# Patient Record
Sex: Female | Born: 1965 | Race: Black or African American | Hispanic: No | Smoking: Current every day smoker
Health system: Southern US, Community
[De-identification: ages and names within clinical notes are randomized; demographics above are authoritative.]

## PROBLEM LIST (undated history)

## (undated) DIAGNOSIS — E042 Nontoxic multinodular goiter: Secondary | ICD-10-CM

## (undated) DIAGNOSIS — D649 Anemia, unspecified: Secondary | ICD-10-CM

## (undated) DIAGNOSIS — F17200 Nicotine dependence, unspecified, uncomplicated: Secondary | ICD-10-CM

## (undated) HISTORY — DX: Nontoxic multinodular goiter: E04.2

## (undated) HISTORY — DX: Nicotine dependence, unspecified, uncomplicated: F17.200

---

## 2000-12-19 ENCOUNTER — Other Ambulatory Visit: Admission: RE | Admit: 2000-12-19 | Discharge: 2000-12-19 | Payer: Self-pay | Admitting: Obstetrics and Gynecology

## 2004-11-14 ENCOUNTER — Ambulatory Visit (HOSPITAL_COMMUNITY): Admission: RE | Admit: 2004-11-14 | Discharge: 2004-11-14 | Payer: Self-pay | Admitting: Obstetrics and Gynecology

## 2004-12-14 ENCOUNTER — Other Ambulatory Visit: Admission: RE | Admit: 2004-12-14 | Discharge: 2004-12-14 | Payer: Self-pay | Admitting: Obstetrics and Gynecology

## 2005-07-27 ENCOUNTER — Emergency Department (HOSPITAL_COMMUNITY): Admission: EM | Admit: 2005-07-27 | Discharge: 2005-07-27 | Payer: Self-pay | Admitting: Emergency Medicine

## 2008-07-31 ENCOUNTER — Emergency Department (HOSPITAL_COMMUNITY): Admission: EM | Admit: 2008-07-31 | Discharge: 2008-07-31 | Payer: Self-pay | Admitting: Emergency Medicine

## 2010-03-02 ENCOUNTER — Ambulatory Visit (HOSPITAL_COMMUNITY)
Admission: RE | Admit: 2010-03-02 | Discharge: 2010-03-02 | Payer: Self-pay | Source: Home / Self Care | Attending: Family Medicine | Admitting: Family Medicine

## 2010-03-03 ENCOUNTER — Encounter: Payer: Self-pay | Admitting: Obstetrics and Gynecology

## 2010-03-04 ENCOUNTER — Encounter: Payer: Self-pay | Admitting: Obstetrics & Gynecology

## 2011-08-26 ENCOUNTER — Emergency Department (HOSPITAL_COMMUNITY)
Admission: EM | Admit: 2011-08-26 | Discharge: 2011-08-26 | Disposition: A | Discharge status: Home / Self Care | Payer: 59 | Attending: Emergency Medicine | Admitting: Emergency Medicine

## 2011-08-26 ENCOUNTER — Encounter (HOSPITAL_COMMUNITY): Payer: Self-pay | Admitting: *Deleted

## 2011-08-26 DIAGNOSIS — L259 Unspecified contact dermatitis, unspecified cause: Secondary | ICD-10-CM | POA: Insufficient documentation

## 2011-08-26 MED ORDER — PREDNISONE 10 MG PO TABS: ORAL_TABLET | ORAL | Status: DC

## 2011-08-26 MED ORDER — DEXAMETHASONE SODIUM PHOSPHATE 10 MG/ML IJ SOLN
10.0000 mg | Freq: Once | INTRAMUSCULAR | Status: AC
  Administered 2011-08-26: 10 mg via INTRAMUSCULAR
  Filled 2011-08-26: qty 1

## 2011-08-26 MED ORDER — DIPHENHYDRAMINE HCL 25 MG PO CAPS
50.0000 mg | ORAL_CAPSULE | Freq: Once | ORAL | Status: AC
  Administered 2011-08-26: 50 mg via ORAL
  Filled 2011-08-26: qty 2

## 2011-08-26 NOTE — ED Notes (Signed)
Scalp itching with rash, used hair dye on Saturday.

## 2011-08-29 NOTE — ED Provider Notes (Signed)
History     CSN: [id number]  Arrival date & time 08/26/11  1906   First MD Initiated Contact with Patient 08/26/11 2006      Chief Complaint  Patient presents with  . Allergic Reaction    (Consider location/radiation/quality/duration/timing/severity/associated sxs/prior treatment) HPI Comments: Crystal Clay presents with itchy and raised rash along hairline and on upper scalp since applying a hair dye 2 days ago.  She reports she still has the product in her hair as she was afraid to spread it.  She has a known allergy to certain dye products,  And has always been careful to use one she will not react to,  But was unable to find it for this weekends use.  She denies fevers,  Chills,  Denies cough,  Shortness of breath,  Mouth,  Tongue or throat swelling.  She has tried no alleviators for her symptoms.  The history is provided by the patient.    History reviewed. No pertinent past medical history.  History reviewed. No pertinent past surgical history.  History reviewed. No pertinent family history.  History  Substance Use Topics  . Smoking status: Current Everyday Smoker  . Smokeless tobacco: Not on file  . Alcohol Use: No    OB History    Grav Para Term Preterm Abortions TAB SAB Ect Mult Living                  Review of Systems  Constitutional: Negative for fever.  HENT: Negative for congestion, sore throat and neck pain.   Eyes: Negative.   Respiratory: Negative for chest tightness and shortness of breath.   Cardiovascular: Negative for chest pain.  Gastrointestinal: Negative for nausea and abdominal pain.  Genitourinary: Negative.   Musculoskeletal: Negative for joint swelling and arthralgias.  Skin: Positive for rash.  Neurological: Negative for dizziness, weakness, light-headedness, numbness and headaches.  Hematological: Negative.   Psychiatric/Behavioral: Negative.     Allergies  Review of patient's allergies indicates no known allergies.  Home  Medications   Current Outpatient Rx  Name Route Sig Dispense Refill  . PREDNISONE 10 MG PO TABS  6, 5, 4, 3, 2 then 1 tablet by mouth daily for 6 days total. 21 tablet 0    BP 144/98  Pulse 94  Temp 98.1 F (36.7 C) (Oral)  Resp 20  SpO2 100%  LMP 08/24/2011  Physical Exam  Constitutional: She appears well-developed and well-nourished. No distress.  HENT:  Head: Normocephalic.  Neck: Neck supple.  Cardiovascular: Normal rate.   Pulmonary/Chest: Effort normal. She has no wheezes.  Musculoskeletal: Normal range of motion. She exhibits no edema.  Skin: Rash noted. Rash is vesicular.       Scattered patches of slightly raised macules with vesicles,  Nondraining along edged of hairline and neck.  Right external ear also has moderated edema and erythema.  No increased warmth,  No drainage or tenderness.  Slight erythema of scalp particularly at crown of head with no vesicles appreciated within hairline.    ED Course  Procedures (including critical care time)  Labs Reviewed - No data to display No results found.   1. Contact dermatitis       MDM  Pt encouraged benadryl both oral,  But can also use topical .  Prednisone taper with decadron injection given prior to dc home.  Pt advised that she needs to wash this product out of her hair as soon as she gets home,  Pt understands plan.  Return here  or see pcp for any worsened sx.    No evidence of infection in these areas of dermatitis.    Burgess Amor, PA 08/29/11 0954  Burgess Amor, PA 08/29/11 [phone]

## 2011-08-29 NOTE — ED Provider Notes (Signed)
Medical screening examination/treatment/procedure(s) were performed by non-physician practitioner and as supervising physician I was immediately available for consultation/collaboration.   Laray Anger, DO 08/29/11 1417

## 2011-09-25 ENCOUNTER — Other Ambulatory Visit: Payer: Self-pay | Admitting: Family Medicine

## 2011-09-25 DIAGNOSIS — E049 Nontoxic goiter, unspecified: Secondary | ICD-10-CM

## 2011-10-01 ENCOUNTER — Ambulatory Visit (HOSPITAL_COMMUNITY)
Admission: RE | Admit: 2011-10-01 | Discharge: 2011-10-01 | Disposition: A | Discharge status: Home / Self Care | Payer: 59 | Source: Ambulatory Visit | Attending: Family Medicine | Admitting: Family Medicine

## 2011-10-01 DIAGNOSIS — E049 Nontoxic goiter, unspecified: Secondary | ICD-10-CM | POA: Insufficient documentation

## 2011-12-30 ENCOUNTER — Other Ambulatory Visit: Payer: Self-pay | Admitting: Family Medicine

## 2011-12-30 DIAGNOSIS — IMO0002 Reserved for concepts with insufficient information to code with codable children: Secondary | ICD-10-CM

## 2012-01-01 ENCOUNTER — Ambulatory Visit (HOSPITAL_COMMUNITY)
Admission: RE | Admit: 2012-01-01 | Discharge: 2012-01-01 | Disposition: A | Discharge status: Home / Self Care | Payer: 59 | Source: Ambulatory Visit | Attending: Family Medicine | Admitting: Family Medicine

## 2012-01-01 DIAGNOSIS — T63391A Toxic effect of venom of other spider, accidental (unintentional), initial encounter: Secondary | ICD-10-CM | POA: Insufficient documentation

## 2012-01-01 DIAGNOSIS — T6391XA Toxic effect of contact with unspecified venomous animal, accidental (unintentional), initial encounter: Secondary | ICD-10-CM | POA: Insufficient documentation

## 2012-01-01 DIAGNOSIS — N63 Unspecified lump in unspecified breast: Secondary | ICD-10-CM | POA: Insufficient documentation

## 2012-01-01 DIAGNOSIS — IMO0002 Reserved for concepts with insufficient information to code with codable children: Secondary | ICD-10-CM

## 2013-05-10 ENCOUNTER — Other Ambulatory Visit: Payer: Self-pay | Admitting: Family Medicine

## 2013-05-10 DIAGNOSIS — Z1231 Encounter for screening mammogram for malignant neoplasm of breast: Secondary | ICD-10-CM

## 2013-05-17 ENCOUNTER — Ambulatory Visit (HOSPITAL_COMMUNITY)
Admission: RE | Admit: 2013-05-17 | Discharge: 2013-05-17 | Disposition: A | Discharge status: Home / Self Care | Payer: 59 | Source: Ambulatory Visit | Attending: Family Medicine | Admitting: Family Medicine

## 2013-05-17 DIAGNOSIS — Z1231 Encounter for screening mammogram for malignant neoplasm of breast: Secondary | ICD-10-CM

## 2014-03-22 ENCOUNTER — Other Ambulatory Visit: Payer: 59

## 2014-03-22 DIAGNOSIS — Z0000 Encounter for general adult medical examination without abnormal findings: Secondary | ICD-10-CM

## 2014-03-22 LAB — CBC WITH DIFFERENTIAL/PLATELET
Basophils Absolute: 0 10*3/uL (ref 0.0–0.1)
Basophils Relative: 0 % (ref 0–1)
Eosinophils Absolute: 0.1 10*3/uL (ref 0.0–0.7)
Eosinophils Relative: 2 % (ref 0–5)
HCT: 37.6 % (ref 36.0–46.0)
Hemoglobin: 11.7 g/dL — ABNORMAL LOW (ref 12.0–15.0)
Lymphocytes Relative: 42 % (ref 12–46)
Lymphs Abs: 2 10*3/uL (ref 0.7–4.0)
MCH: 25.1 pg — AB (ref 26.0–34.0)
MCHC: 31.1 g/dL (ref 30.0–36.0)
MCV: 80.7 fL (ref 78.0–100.0)
MPV: 9.3 fL (ref 8.6–12.4)
Monocytes Absolute: 0.4 10*3/uL (ref 0.1–1.0)
Monocytes Relative: 9 % (ref 3–12)
NEUTROS PCT: 47 % (ref 43–77)
Neutro Abs: 2.3 10*3/uL (ref 1.7–7.7)
Platelets: 331 10*3/uL (ref 150–400)
RBC: 4.66 MIL/uL (ref 3.87–5.11)
RDW: 20.4 % — ABNORMAL HIGH (ref 11.5–15.5)
WBC: 4.8 10*3/uL (ref 4.0–10.5)

## 2014-03-22 LAB — LIPID PANEL
CHOL/HDL RATIO: 3.8 ratio
Cholesterol: 156 mg/dL (ref 0–200)
HDL: 41 mg/dL (ref >39)
LDL Cholesterol: 100 mg/dL — ABNORMAL HIGH (ref 0–99)
Triglycerides: 77 mg/dL (ref <150)
VLDL: 15 mg/dL (ref 0–40)

## 2014-03-22 LAB — COMPLETE METABOLIC PANEL WITH GFR
ALT: <8 U/L (ref 0–35)
AST: 13 U/L (ref 0–37)
Albumin: 3.8 g/dL (ref 3.5–5.2)
Alkaline Phosphatase: 102 U/L (ref 39–117)
BUN: 7 mg/dL (ref 6–23)
CO2: 25 mEq/L (ref 19–32)
Calcium: 8.8 mg/dL (ref 8.4–10.5)
Chloride: 105 mEq/L (ref 96–112)
Creat: 0.52 mg/dL (ref 0.50–1.10)
GFR, Est African American: >89 mL/min
GFR, Est Non African American: >89 mL/min
Glucose, Bld: 88 mg/dL (ref 70–99)
Potassium: 4.3 mEq/L (ref 3.5–5.3)
Sodium: 139 mEq/L (ref 135–145)
Total Bilirubin: 0.4 mg/dL (ref 0.2–1.2)
Total Protein: 7 g/dL (ref 6.0–8.3)

## 2014-03-22 LAB — TSH: TSH: 1.179 u[IU]/mL (ref 0.350–4.500)

## 2014-03-25 ENCOUNTER — Encounter: Payer: Self-pay | Admitting: Family Medicine

## 2014-03-25 ENCOUNTER — Ambulatory Visit (INDEPENDENT_AMBULATORY_CARE_PROVIDER_SITE_OTHER): Payer: 59 | Admitting: Family Medicine

## 2014-03-25 VITALS — BP 140/80 | HR 86 | Temp 98.2°F | Resp 18 | Ht 69.5 in | Wt 218.0 lb

## 2014-03-25 DIAGNOSIS — F17200 Nicotine dependence, unspecified, uncomplicated: Secondary | ICD-10-CM | POA: Insufficient documentation

## 2014-03-25 DIAGNOSIS — Z0000 Encounter for general adult medical examination without abnormal findings: Secondary | ICD-10-CM

## 2014-03-25 NOTE — Progress Notes (Signed)
Subjective:    Patient ID: Crystal Clay, female    DOB: Nov 06, 1965, 49 y.o.   MRN: [id number]  HPI  Patient is here today for complete physical exam. Unfortunately she continues to smoke. Her last Pap smear was in 2014. However she is on her period today and she prefer that we defer this to another time. She is not due for a colonoscopy. She is due for mammogram given her strong family history of breast cancer. Her fasting blood work was reviewed with the patient today and is listed below: Lab on 03/22/2014  Component Date Value Ref Range Status  . Sodium 03/22/2014 139  135 - 145 mEq/L Final  . Potassium 03/22/2014 4.3  3.5 - 5.3 mEq/L Final  . Chloride 03/22/2014 105  96 - 112 mEq/L Final  . CO2 03/22/2014 25  19 - 32 mEq/L Final  . Glucose, Bld 03/22/2014 88  70 - 99 mg/dL Final  . BUN 03/22/2014 7  6 - 23 mg/dL Final  . Creat 03/22/2014 0.52  0.50 - 1.10 mg/dL Final  . Total Bilirubin 03/22/2014 0.4  0.2 - 1.2 mg/dL Final  . Alkaline Phosphatase 03/22/2014 102  39 - 117 U/L Final  . AST 03/22/2014 13  0 - 37 U/L Final  . ALT 03/22/2014 <8  0 - 35 U/L Final  . Total Protein 03/22/2014 7.0  6.0 - 8.3 g/dL Final  . Albumin 03/22/2014 3.8  3.5 - 5.2 g/dL Final  . Calcium 03/22/2014 8.8  8.4 - 10.5 mg/dL Final  . GFR, Est African American 03/22/2014 >89   Final  . GFR, Est Non African American 03/22/2014 >89   Final   Comment:   The estimated GFR is a calculation valid for adults (>=18 years old) that uses the CKD-EPI algorithm to adjust for age and sex. It is   not to be used for children, pregnant women, hospitalized patients,    patients on dialysis, or with rapidly changing kidney function. According to the NKDEP, eGFR >89 is normal, 60-89 shows mild impairment, 30-59 shows moderate impairment, 15-29 shows severe impairment and <15 is ESRD.     . TSH 03/22/2014 1.179  0.350 - 4.500 uIU/mL Final  . Cholesterol 03/22/2014 156  0 - 200 mg/dL Final   Comment: ATP III  Classification:       < 200        mg/dL        Desirable      200 - 239     mg/dL        Borderline High      >= 240        mg/dL        High     . Triglycerides 03/22/2014 77  <150 mg/dL Final  . HDL 03/22/2014 41  >39 mg/dL Final  . Total CHOL/HDL Ratio 03/22/2014 3.8   Final  . VLDL 03/22/2014 15  0 - 40 mg/dL Final  . LDL Cholesterol 03/22/2014 100* 0 - 99 mg/dL Final   Comment:   Total Cholesterol/HDL Ratio:CHD Risk                        Coronary Heart Disease Risk Table                                        Men  Women          1/2 Average Risk              3.4        3.3              Average Risk              5.0        4.4           2X Average Risk              9.6        7.1           3X Average Risk             23.4       11.0 Use the calculated Patient Ratio above and the CHD Risk table  to determine the patient's CHD Risk. ATP III Classification (LDL):       < 100        mg/dL         Optimal      100 - 129     mg/dL         Near or Above Optimal      130 - 159     mg/dL         Borderline High      160 - 189     mg/dL         High       > 190        mg/dL         Very High     . WBC 03/22/2014 4.8  4.0 - 10.5 K/uL Final  . RBC 03/22/2014 4.66  3.87 - 5.11 MIL/uL Final  . Hemoglobin 03/22/2014 11.7* 12.0 - 15.0 g/dL Final  . HCT 03/22/2014 37.6  36.0 - 46.0 % Final  . MCV 03/22/2014 80.7  78.0 - 100.0 fL Final  . MCH 03/22/2014 25.1* 26.0 - 34.0 pg Final  . MCHC 03/22/2014 31.1  30.0 - 36.0 g/dL Final  . RDW 03/22/2014 20.4* 11.5 - 15.5 % Final  . Platelets 03/22/2014 331  150 - 400 K/uL Final  . MPV 03/22/2014 9.3  8.6 - 12.4 fL Final  . Neutrophils Relative % 03/22/2014 47  43 - 77 % Final  . Neutro Abs 03/22/2014 2.3  1.7 - 7.7 K/uL Final  . Lymphocytes Relative 03/22/2014 42  12 - 46 % Final  . Lymphs Abs 03/22/2014 2.0  0.7 - 4.0 K/uL Final  . Monocytes Relative 03/22/2014 9  3 - 12 % Final  . Monocytes Absolute 03/22/2014 0.4  0.1 - 1.0 K/uL Final    . Eosinophils Relative 03/22/2014 2  0 - 5 % Final  . Eosinophils Absolute 03/22/2014 0.1  0.0 - 0.7 K/uL Final  . Basophils Relative 03/22/2014 0  0 - 1 % Final  . Basophils Absolute 03/22/2014 0.0  0.0 - 0.1 K/uL Final  . Smear Review 03/22/2014 Criteria for review not met   Final   Past Medical History  Diagnosis Date  . Smoker   . Multinodular goiter    No past surgical history on file. No current outpatient prescriptions on file prior to visit.   No current facility-administered medications on file prior to visit.   No Known Allergies History   Social History  . Marital Status: Divorced    Spouse Name: N/A  . Number of Children:  N/A  . Years of Education: N/A   Occupational History  . Not on file.   Social History Main Topics  . Smoking status: Current Every Day Smoker  . Smokeless tobacco: Not on file  . Alcohol Use: No  . Drug Use: No  . Sexual Activity: Not on file     Comment: divorced, 1 son, Web designer   Other Topics Concern  . Not on file   Social History Narrative   Family History  Problem Relation Age of Onset  . Cancer Mother     breast  . COPD Mother   . Cancer Sister     breast  . Heart disease Maternal Grandfather      Review of Systems  All other systems reviewed and are negative.      Objective:   Physical Exam  Constitutional: She is oriented to person, place, and time. She appears well-developed and well-nourished. No distress.  HENT:  Head: Normocephalic and atraumatic.  Right Ear: External ear normal.  Left Ear: External ear normal.  Nose: Nose normal.  Mouth/Throat: Oropharynx is clear and moist. No oropharyngeal exudate.  Eyes: Conjunctivae and EOM are normal. Pupils are equal, round, and reactive to light. Right eye exhibits no discharge. Left eye exhibits no discharge. No scleral icterus.  Neck: Normal range of motion. Neck supple. No JVD present. No tracheal deviation present. No thyromegaly present.   Cardiovascular: Normal rate, regular rhythm, normal heart sounds and intact distal pulses.  Exam reveals no gallop and no friction rub.   No murmur heard. Pulmonary/Chest: Effort normal and breath sounds normal. No stridor. No respiratory distress. She has no wheezes. She has no rales. She exhibits no tenderness.  Abdominal: Soft. Bowel sounds are normal. She exhibits no distension and no mass. There is no tenderness. There is no rebound and no guarding.  Musculoskeletal: Normal range of motion. She exhibits no edema or tenderness.  Lymphadenopathy:    She has no cervical adenopathy.  Neurological: She is alert and oriented to person, place, and time. She has normal reflexes. She displays normal reflexes. No cranial nerve deficit. She exhibits normal muscle tone. Coordination normal.  Skin: Skin is warm. No rash noted. She is not diaphoretic. No erythema. No pallor.  Psychiatric: She has a normal mood and affect. Her behavior is normal. Judgment and thought content normal.  Vitals reviewed.         Assessment & Plan:  Routine general medical examination at a health care facility - Plan: MM Digital Screening  I recommended smoking cessation. I also recommended the patient check her blood pressure everyday for the next week and bring the values to me so that I can reviewed. If her blood pressures consistently greater than 140/90 I would add medication. I will schedule the patient for a mammogram. Also gave the patient a flu shot today. We can defer her Pap smear until a more convenient time for the patient.

## 2014-03-29 ENCOUNTER — Encounter: Payer: Self-pay | Admitting: *Deleted

## 2014-07-14 ENCOUNTER — Other Ambulatory Visit: Payer: Self-pay | Admitting: Family Medicine

## 2014-07-14 DIAGNOSIS — Z1231 Encounter for screening mammogram for malignant neoplasm of breast: Secondary | ICD-10-CM

## 2014-08-08 ENCOUNTER — Ambulatory Visit (HOSPITAL_COMMUNITY)
Admission: RE | Admit: 2014-08-08 | Discharge: 2014-08-08 | Disposition: A | Discharge status: Home / Self Care | Payer: 59 | Source: Ambulatory Visit | Attending: Family Medicine | Admitting: Family Medicine

## 2014-08-08 DIAGNOSIS — Z1231 Encounter for screening mammogram for malignant neoplasm of breast: Secondary | ICD-10-CM

## 2015-03-23 ENCOUNTER — Ambulatory Visit (INDEPENDENT_AMBULATORY_CARE_PROVIDER_SITE_OTHER): Payer: 59 | Admitting: Family Medicine

## 2015-03-23 ENCOUNTER — Encounter: Payer: Self-pay | Admitting: Family Medicine

## 2015-03-23 VITALS — BP 132/78 | HR 78 | Temp 98.0°F | Resp 18 | Ht 69.5 in | Wt 242.0 lb

## 2015-03-23 DIAGNOSIS — J45901 Unspecified asthma with (acute) exacerbation: Secondary | ICD-10-CM

## 2015-03-23 MED ORDER — PREDNISONE 20 MG PO TABS
ORAL_TABLET | ORAL | Status: DC
Start: 2015-03-23 — End: 2015-05-19

## 2015-03-23 MED ORDER — ALBUTEROL SULFATE HFA 108 (90 BASE) MCG/ACT IN AERS: 2.0000 | INHALATION_SPRAY | Freq: Four times a day (QID) | RESPIRATORY_TRACT | Status: DC | PRN

## 2015-03-23 MED ORDER — AZITHROMYCIN 250 MG PO TABS: ORAL_TABLET | ORAL | Status: DC

## 2015-03-23 NOTE — Progress Notes (Signed)
   Subjective:    Patient ID: Crystal Clay, female    DOB: 1965/09/02, 50 y.o.   MRN: [id number]  HPI  patient got sick one week ago. Over the last week symptoms have worsened. She has a cough productive of yellow sputum, pleurisy, increasing shortness of breath , intercostal pain with coughing. And audible wheezing. She does smoke. She has a strong family history of COPD. On examination today she has diminished breath sounds bilaterally with expiratory wheezing and rhonchorous breath sounds Past Medical History  Diagnosis Date  . Smoker   . Multinodular goiter    No past surgical history on file. Current Outpatient Prescriptions on File Prior to Visit  Medication Sig Dispense Refill  . naproxen sodium (ANAPROX) 220 MG tablet Take 220 mg by mouth 2 (two) times daily with a meal.     No current facility-administered medications on file prior to visit.   No Known Allergies Social History   Social History  . Marital Status: Divorced    Spouse Name: N/A  . Number of Children: N/A  . Years of Education: N/A   Occupational History  . Not on file.   Social History Main Topics  . Smoking status: Current Every Day Smoker  . Smokeless tobacco: Not on file  . Alcohol Use: No  . Drug Use: No  . Sexual Activity: Not on file     Comment: divorced, 1 son, Web designer   Other Topics Concern  . Not on file   Social History Narrative      Review of Systems  All other systems reviewed and are negative.      Objective:   Physical Exam  Constitutional: She appears well-developed and well-nourished.  HENT:  Right Ear: External ear normal.  Left Ear: External ear normal.  Nose: Nose normal.  Mouth/Throat: Oropharynx is clear and moist.  Neck: Neck supple.  Cardiovascular: Normal rate, regular rhythm and normal heart sounds.   Pulmonary/Chest: Effort normal. She has wheezes.  Abdominal: Soft. Bowel sounds are normal.  Lymphadenopathy:    She has no cervical  adenopathy.  Vitals reviewed.         Assessment & Plan:  Asthmatic bronchitis with acute exacerbation - Plan: azithromycin (ZITHROMAX) 250 MG tablet, predniSONE (DELTASONE) 20 MG tablet, albuterol (PROVENTIL HFA;VENTOLIN HFA) 108 (90 Base) MCG/ACT inhaler   Patient has bronchitis.  There is an element of reactive airway disease or possibly early signs of COPD. I have recommended smoking cessation. Meanwhile I'll treat her with antibiotics , prednisone taper pack over 6 days, and albuterol 2 puffs inhaled every 6 hours as needed. Patient states that she will work on quitting smoking

## 2015-05-05 ENCOUNTER — Encounter: Payer: 59 | Admitting: Family Medicine

## 2015-05-19 ENCOUNTER — Ambulatory Visit (INDEPENDENT_AMBULATORY_CARE_PROVIDER_SITE_OTHER): Payer: 59 | Admitting: Family Medicine

## 2015-05-19 ENCOUNTER — Encounter: Payer: Self-pay | Admitting: Family Medicine

## 2015-05-19 VITALS — BP 136/90 | HR 76 | Temp 98.0°F | Resp 18 | Ht 69.0 in | Wt 241.0 lb

## 2015-05-19 DIAGNOSIS — Z0000 Encounter for general adult medical examination without abnormal findings: Secondary | ICD-10-CM | POA: Diagnosis not present

## 2015-05-19 DIAGNOSIS — E042 Nontoxic multinodular goiter: Secondary | ICD-10-CM | POA: Diagnosis not present

## 2015-05-19 LAB — COMPLETE METABOLIC PANEL WITH GFR
ALBUMIN: 3.9 g/dL (ref 3.6–5.1)
ALK PHOS: 101 U/L (ref 33–115)
ALT: 10 U/L (ref 6–29)
AST: 14 U/L (ref 10–35)
BUN: 10 mg/dL (ref 7–25)
CO2: 27 mmol/L (ref 20–31)
Calcium: 8.6 mg/dL (ref 8.6–10.2)
Chloride: 107 mmol/L (ref 98–110)
Creat: 0.52 mg/dL (ref 0.50–1.10)
GFR, Est African American: >89 mL/min (ref >=60)
GFR, Est Non African American: >89 mL/min (ref >=60)
Glucose, Bld: 93 mg/dL (ref 70–99)
Potassium: 4.2 mmol/L (ref 3.5–5.3)
SODIUM: 135 mmol/L (ref 135–146)
Total Bilirubin: 0.3 mg/dL (ref 0.2–1.2)
Total Protein: 6.6 g/dL (ref 6.1–8.1)

## 2015-05-19 LAB — LIPID PANEL
Cholesterol: 161 mg/dL (ref 125–200)
HDL: 44 mg/dL — ABNORMAL LOW (ref >=46)
LDL Cholesterol: 105 mg/dL (ref <130)
Total CHOL/HDL Ratio: 3.7 Ratio (ref <=5.0)
Triglycerides: 59 mg/dL (ref <150)
VLDL: 12 mg/dL (ref <30)

## 2015-05-19 LAB — CBC WITH DIFFERENTIAL/PLATELET
BASOS ABS: 0 {cells}/uL (ref 0–200)
Basophils Relative: 0 %
Eosinophils Absolute: 102 cells/uL (ref 15–500)
Eosinophils Relative: 2 %
HCT: 36.8 % (ref 35.0–45.0)
HEMOGLOBIN: 11.9 g/dL — AB (ref 12.0–15.0)
Lymphocytes Relative: 43 %
Lymphs Abs: 2193 cells/uL (ref 850–3900)
MCH: 29.2 pg (ref 27.0–33.0)
MCHC: 32.3 g/dL (ref 32.0–36.0)
MCV: 90.4 fL (ref 80.0–100.0)
MPV: 9.8 fL (ref 7.5–12.5)
Monocytes Absolute: 306 cells/uL (ref 200–950)
Monocytes Relative: 6 %
NEUTROS ABS: 2499 {cells}/uL (ref 1500–7800)
Neutrophils Relative %: 49 %
Platelets: 322 10*3/uL (ref 140–400)
RBC: 4.07 MIL/uL (ref 3.80–5.10)
RDW: 15.3 % — ABNORMAL HIGH (ref 11.0–15.0)
WBC: 5.1 10*3/uL (ref 3.8–10.8)

## 2015-05-19 LAB — TSH: TSH: 1.14 mIU/L

## 2015-05-19 NOTE — Progress Notes (Signed)
Subjective:    Patient ID: Crystal Clay, female    DOB: Feb 13, 1965, 50 y.o.   MRN: [id number]  HPI   Patient is here today for complete physical exam. Unfortunately she continues to smoke. Her last Pap smear was in 2014. However she is on her period today and she prefer that we defer this to another time. She is not due for a colonoscopy.  Her last mammogram was 6/16. She is willing to allow me to go ahead and schedule her to see the gastroenterologist for colonoscopy after she turns 50. This would be in August. Worsened swelling particularly on the right side of her neck. On examination today she does have a palpable goiter although I cannot appreciate any discrete mass in the goiter. She is also reporting weight gain and fatigue   Past Medical History  Diagnosis Date  . Smoker   . Multinodular goiter    No past surgical history on file. Current Outpatient Prescriptions on File Prior to Visit  Medication Sig Dispense Refill  . albuterol (PROVENTIL HFA;VENTOLIN HFA) 108 (90 Base) MCG/ACT inhaler Inhale 2 puffs into the lungs every 6 (six) hours as needed for wheezing or shortness of breath. 1 Inhaler 0  . naproxen sodium (ANAPROX) 220 MG tablet Take 220 mg by mouth 2 (two) times daily as needed.      No current facility-administered medications on file prior to visit.   No Known Allergies Social History   Social History  . Marital Status: Divorced    Spouse Name: N/A  . Number of Children: N/A  . Years of Education: N/A   Occupational History  . Not on file.   Social History Main Topics  . Smoking status: Current Every Day Smoker  . Smokeless tobacco: Not on file  . Alcohol Use: No  . Drug Use: No  . Sexual Activity: Not on file     Comment: divorced, 1 son, Web designer   Other Topics Concern  . Not on file   Social History Narrative   Family History  Problem Relation Age of Onset  . Cancer Mother     breast  . COPD Mother   . Cancer Sister    breast  . Heart disease Maternal Grandfather      Review of Systems  All other systems reviewed and are negative.      Objective:   Physical Exam  Constitutional: She is oriented to person, place, and time. She appears well-developed and well-nourished. No distress.  HENT:  Head: Normocephalic and atraumatic.  Right Ear: External ear normal.  Left Ear: External ear normal.  Nose: Nose normal.  Mouth/Throat: Oropharynx is clear and moist. No oropharyngeal exudate.  Eyes: Conjunctivae and EOM are normal. Pupils are equal, round, and reactive to light. Right eye exhibits no discharge. Left eye exhibits no discharge. No scleral icterus.  Neck: Normal range of motion. Neck supple. No JVD present. No tracheal deviation present. Thyromegaly present.  Cardiovascular: Normal rate, regular rhythm, normal heart sounds and intact distal pulses.  Exam reveals no gallop and no friction rub.   No murmur heard. Pulmonary/Chest: Effort normal and breath sounds normal. No stridor. No respiratory distress. She has no wheezes. She has no rales. She exhibits no tenderness.  Abdominal: Soft. Bowel sounds are normal. She exhibits no distension and no mass. There is no tenderness. There is no rebound and no guarding.  Musculoskeletal: Normal range of motion. She exhibits no edema or tenderness.  Lymphadenopathy:  She has no cervical adenopathy.  Neurological: She is alert and oriented to person, place, and time. She has normal reflexes. No cranial nerve deficit. She exhibits normal muscle tone. Coordination normal.  Skin: Skin is warm. No rash noted. She is not diaphoretic. No erythema. No pallor.  Psychiatric: She has a normal mood and affect. Her behavior is normal. Judgment and thought content normal.  Vitals reviewed.         Assessment & Plan:  Routine general medical examination at a health care facility - Plan: CBC with Differential/Platelet, COMPLETE METABOLIC PANEL WITH GFR, Lipid  panel Strongly recommended smoking cessation. Blood pressures borderline. I will schedule the patient for a thyroid ultrasound to rule out malignancies within the thyroid gland. If the ultrasound confirms a multinodular goiter, we can consider starting her on levothyroxine to prevent it from growing larger. I will also check a fasting lipid panel. Because of her smoking and would like her LDL cholesterol be below 100. I will check a CMP as well as a CBC. Offer the patient a flu shot and a tetanus shot but she politely declined. She will schedule her mammogram. She would like to reschedule a Pap smear

## 2015-05-22 ENCOUNTER — Encounter: Payer: Self-pay | Admitting: Family Medicine

## 2015-05-24 ENCOUNTER — Ambulatory Visit (HOSPITAL_COMMUNITY): Payer: 59

## 2015-07-03 ENCOUNTER — Ambulatory Visit (HOSPITAL_COMMUNITY): Admission: RE | Admit: 2015-07-03 | Payer: 59 | Source: Ambulatory Visit

## 2015-10-30 ENCOUNTER — Other Ambulatory Visit: Payer: Self-pay | Admitting: Family Medicine

## 2015-10-30 DIAGNOSIS — Z1231 Encounter for screening mammogram for malignant neoplasm of breast: Secondary | ICD-10-CM

## 2015-11-06 ENCOUNTER — Encounter (HOSPITAL_COMMUNITY): Payer: Self-pay | Admitting: Radiology

## 2015-11-06 ENCOUNTER — Ambulatory Visit (HOSPITAL_COMMUNITY)
Admission: RE | Admit: 2015-11-06 | Discharge: 2015-11-06 | Disposition: A | Discharge status: Home / Self Care | Payer: 59 | Source: Ambulatory Visit | Attending: Family Medicine | Admitting: Family Medicine

## 2015-11-06 DIAGNOSIS — Z1231 Encounter for screening mammogram for malignant neoplasm of breast: Secondary | ICD-10-CM | POA: Diagnosis not present

## 2016-02-23 ENCOUNTER — Ambulatory Visit (INDEPENDENT_AMBULATORY_CARE_PROVIDER_SITE_OTHER): Payer: 59 | Admitting: Family Medicine

## 2016-02-23 ENCOUNTER — Encounter: Payer: Self-pay | Admitting: Family Medicine

## 2016-02-23 VITALS — BP 142/88 | HR 68 | Temp 99.2°F | Resp 18 | Ht 69.0 in | Wt 247.0 lb

## 2016-02-23 DIAGNOSIS — J111 Influenza due to unidentified influenza virus with other respiratory manifestations: Secondary | ICD-10-CM | POA: Diagnosis not present

## 2016-02-23 MED ORDER — HYDROCODONE-HOMATROPINE 5-1.5 MG/5ML PO SYRP: 5.0000 mL | ORAL_SOLUTION | Freq: Three times a day (TID) | ORAL | 0 refills | Status: DC | PRN

## 2016-02-23 NOTE — Progress Notes (Signed)
   Subjective:    Patient ID: Crystal Clay, female    DOB: 1965-06-04, 51 y.o.   MRN: [id number]  HPI  This began on Sunday a proximally 5 days ago with sore throat. Within 24 hours the patient developed diffuse body aches and myalgias and low-grade fever to 101. She also developed runny nose and congestion. Wednesday symptoms peaked and she felt chest pain and shortness of breath and just generally rotten. However she started to improve yesterday and today she feels even better. She is wheezing on exam slightly but she has not used her albuterol today. She denies any fever this morning. She denies any chest pain or pleurisy. She denies any purulent sputum Past Medical History:  Diagnosis Date  . Multinodular goiter   . Smoker    No past surgical history on file. Current Outpatient Prescriptions on File Prior to Visit  Medication Sig Dispense Refill  . albuterol (PROVENTIL HFA;VENTOLIN HFA) 108 (90 Base) MCG/ACT inhaler Inhale 2 puffs into the lungs every 6 (six) hours as needed for wheezing or shortness of breath. 1 Inhaler 0   No current facility-administered medications on file prior to visit.    No Known Allergies Social History   Social History  . Marital status: Divorced    Spouse name: N/A  . Number of children: N/A  . Years of education: N/A   Occupational History  . Not on file.   Social History Main Topics  . Smoking status: Current Every Day Smoker  . Smokeless tobacco: Not on file  . Alcohol use No  . Drug use: No  . Sexual activity: Not on file     Comment: divorced, 1 son, Web designer   Other Topics Concern  . Not on file   Social History Narrative  . No narrative on file     Review of Systems  All other systems reviewed and are negative.      Objective:   Physical Exam  Constitutional: She appears well-developed and well-nourished. No distress.  HENT:  Right Ear: External ear normal.  Left Ear: External ear normal.  Nose: Mucosal  edema and rhinorrhea present.  Mouth/Throat: Oropharynx is clear and moist. No oropharyngeal exudate.  Eyes: Conjunctivae are normal.  Neck: Neck supple.  Cardiovascular: Normal rate, regular rhythm and normal heart sounds.   Pulmonary/Chest: Effort normal. She has wheezes. She has no rales.  Abdominal: Soft. Bowel sounds are normal.  Lymphadenopathy:    She has no cervical adenopathy.  Skin: She is not diaphoretic.  Vitals reviewed.         Assessment & Plan:  Influenza with respiratory manifestation - Plan: HYDROcodone-homatropine (HYCODAN) 5-1.5 MG/5ML syrup  Clinically the patient has influenza. Unfortunately she starting to improve. I recommended tincture of time. She is outside the therapeutic window for Tamiflu. Use ibuprofen as needed for body aches and fever. Use Mucinex as needed for cough. Also gave her Hycodan 1 teaspoon every 8 hours as needed for cough. I anticipate gradual improvement with complete resolution by Wednesday of next week if not sooner. Recheck immediately should her symptoms worsen or should she develop symptoms of pneumonia

## 2017-01-14 ENCOUNTER — Other Ambulatory Visit: Payer: Self-pay | Admitting: Family Medicine

## 2017-01-14 DIAGNOSIS — Z1231 Encounter for screening mammogram for malignant neoplasm of breast: Secondary | ICD-10-CM

## 2017-01-30 ENCOUNTER — Ambulatory Visit (HOSPITAL_COMMUNITY)
Admission: RE | Admit: 2017-01-30 | Discharge: 2017-01-30 | Disposition: A | Discharge status: Home / Self Care | Payer: 59 | Source: Ambulatory Visit | Attending: Family Medicine | Admitting: Family Medicine

## 2017-01-30 DIAGNOSIS — Z1231 Encounter for screening mammogram for malignant neoplasm of breast: Secondary | ICD-10-CM | POA: Diagnosis present

## 2017-02-15 DIAGNOSIS — M7061 Trochanteric bursitis, right hip: Secondary | ICD-10-CM | POA: Diagnosis not present

## 2017-02-17 ENCOUNTER — Ambulatory Visit: Payer: 59 | Admitting: Family Medicine

## 2017-02-19 ENCOUNTER — Encounter: Payer: Self-pay | Admitting: Family Medicine

## 2017-02-26 ENCOUNTER — Emergency Department (HOSPITAL_COMMUNITY): Payer: 59

## 2017-02-26 ENCOUNTER — Emergency Department (HOSPITAL_COMMUNITY)
Admission: EM | Admit: 2017-02-26 | Discharge: 2017-02-26 | Disposition: A | Discharge status: Home / Self Care | Payer: 59 | Attending: Emergency Medicine | Admitting: Emergency Medicine

## 2017-02-26 ENCOUNTER — Encounter (HOSPITAL_COMMUNITY): Payer: Self-pay | Admitting: *Deleted

## 2017-02-26 ENCOUNTER — Ambulatory Visit: Payer: 59 | Admitting: Family Medicine

## 2017-02-26 ENCOUNTER — Other Ambulatory Visit: Payer: Self-pay

## 2017-02-26 DIAGNOSIS — Z79899 Other long term (current) drug therapy: Secondary | ICD-10-CM | POA: Diagnosis not present

## 2017-02-26 DIAGNOSIS — M5416 Radiculopathy, lumbar region: Secondary | ICD-10-CM | POA: Diagnosis not present

## 2017-02-26 DIAGNOSIS — M79651 Pain in right thigh: Secondary | ICD-10-CM | POA: Diagnosis not present

## 2017-02-26 DIAGNOSIS — M25551 Pain in right hip: Secondary | ICD-10-CM | POA: Diagnosis present

## 2017-02-26 DIAGNOSIS — M5417 Radiculopathy, lumbosacral region: Secondary | ICD-10-CM | POA: Diagnosis not present

## 2017-02-26 DIAGNOSIS — F17210 Nicotine dependence, cigarettes, uncomplicated: Secondary | ICD-10-CM | POA: Diagnosis not present

## 2017-02-26 HISTORY — DX: Anemia, unspecified: D64.9

## 2017-02-26 MED ORDER — HYDROCODONE-ACETAMINOPHEN 5-325 MG PO TABS: ORAL_TABLET | ORAL | 0 refills | Status: DC

## 2017-02-26 MED ORDER — NAPROXEN 500 MG PO TABS: 500.0000 mg | ORAL_TABLET | Freq: Two times a day (BID) | ORAL | 0 refills | Status: DC

## 2017-02-26 MED ORDER — PREDNISONE 20 MG PO TABS: ORAL_TABLET | ORAL | 0 refills | Status: DC

## 2017-02-26 NOTE — Discharge Instructions (Signed)
Please read and follow all provided instructions.  Your diagnoses today include:  1. Lumbosacral radiculopathy at L4     Tests performed today include:  Vital signs - see below for your results today  X-ray of your femur - no problems  X-ray of lumbar spine - shows some arthritis and disc space narrowing between your 4th and 5th lumbar vertebrae  Medications prescribed:   Prednisone - steroid medicine   It is best to take this medication in the morning to prevent sleeping problems. If you are diabetic, monitor your blood sugar closely and stop taking Prednisone if blood sugar is over 300. Take with food to prevent stomach upset.    Naproxen - anti-inflammatory pain medication  Do not exceed 500mg  naproxen every 12 hours, take with food  You have been prescribed an anti-inflammatory medication or NSAID. Take with food. Take smallest effective dose for the shortest duration needed for your pain. Stop taking if you experience stomach pain or vomiting.    Vicodin (hydrocodone/acetaminophen) - narcotic pain medication  DO NOT drive or perform any activities that require you to be awake and alert because this medicine can make you drowsy. BE VERY CAREFUL not to take multiple medicines containing Tylenol (also called acetaminophen). Doing so can lead to an overdose which can damage your liver and cause liver failure and possibly death.  Take any prescribed medications only as directed.  Home care instructions:   Follow any educational materials contained in this packet  Please rest, use ice or heat on your back for the next several days  Do not lift, push, pull anything more than 10 pounds for the next week  Follow-up instructions: Please follow-up with your primary care provider as planned for further evaluation of your symptoms.   Return instructions:  SEEK IMMEDIATE MEDICAL ATTENTION IF YOU HAVE:  New numbness, tingling, weakness, or problem with the use of your arms or  legs  Severe back pain not relieved with medications  Loss control of your bowels or bladder  Increasing pain in any areas of the body (such as chest or abdominal pain)  Shortness of breath, dizziness, or fainting.   Worsening nausea (feeling sick to your stomach), vomiting, fever, or sweats  Any other emergent concerns regarding your health   Additional Information:  Your vital signs today were: BP (!) 159/86 (BP Location: Right Arm)    Pulse 74    Temp 98.2 F (36.8 C) (Oral)    Resp 16    Ht 5' 9.5" (1.765 m)    Wt 108.4 kg (239 lb)    SpO2 96%    BMI 34.79 kg/m  If your blood pressure (BP) was elevated above 135/85 this visit, please have this repeated by your doctor within one month. --------------

## 2017-02-26 NOTE — ED Triage Notes (Signed)
Pt reports ongoing severe pain and tingling to right hip and entire leg. Has been to md for it and given meds with no relief. Pain is into her groin and pt has difficulty bearing weight and walking when pain occurs. Pain started 3 weeks ago and getting progressively worse. Has occ numbness sensation to right arm when pain occurs.

## 2017-02-26 NOTE — ED Provider Notes (Signed)
[facility] EMERGENCY DEPARTMENT Provider Note   CSN: [id number] Arrival date & time: 02/26/17  2694     History   Chief Complaint Chief Complaint  Patient presents with  . Leg Pain    HPI Crystal Clay is a 52 y.o. female.  Patient presents to the emergency department today with complaints of right hip and thigh pain described as burning and shooting, severe at times, starting 4 weeks ago.  Pain was initially more mild at first and responded to over-the-counter medications at home.  When pain did not improve she went to an urgent care and was prescribed diclofenac.  This helped her symptoms at first, however was less responsive at end of course which she has now completed.  Pain is better when she is sitting in an upright position, worse with bearing weight and with other certain positions.  She has an appointment with her primary care doctor tomorrow for this, however pain was so bad today she could not wait to be seen. Patient denies warning symptoms of back pain including: fecal incontinence, urinary retention or overflow incontinence, night sweats, waking from sleep with back pain, unexplained fevers or weight loss, h/o cancer, IVDU, recent trauma.  She has never had pain like this before.  No abdominal pain or urinary symptoms.  Patient does not have significant pain in her back but starts in her posterior right hip. The onset of this condition was acute. The course is gradually worsening.    She also complains of R UE paresthesias that occur occasionally with her leg pain.  She denies any weakness.  No neck symptoms.  She denies any injuries or falls.       Past Medical History:  Diagnosis Date  . Anemia   . Multinodular goiter   . Smoker     Patient Active Problem List   Diagnosis Date Noted  . Smoker     History reviewed. No pertinent surgical history.  OB History    No data available       Home Medications    Prior to Admission  medications   Medication Sig Start Date End Date Taking? Authorizing Provider  albuterol (PROVENTIL HFA;VENTOLIN HFA) 108 (90 Base) MCG/ACT inhaler Inhale 2 puffs into the lungs every 6 (six) hours as needed for wheezing or shortness of breath. 03/23/15  Yes Susy Frizzle, MD  aspirin-acetaminophen-caffeine (EXCEDRIN MIGRAINE) [phone] MG tablet Take 2 tablets by mouth every 6 (six) hours as needed for headache.   Yes [provider]  diclofenac (VOLTAREN) 75 MG EC tablet Take 75 mg by mouth 2 (two) times daily. 02/15/17  Yes [provider]  Menthol-Methyl Salicylate (MUSCLE RUB) 10-15 % CREA Apply 1 application topically as needed for muscle pain.   Yes [provider]  naproxen sodium (ALEVE) 220 MG tablet Take 440 mg by mouth as needed (pain).   Yes [provider]  HYDROcodone-acetaminophen (NORCO/VICODIN) 5-325 MG tablet Take 1-2 tablets every 6 hours as needed for severe pain 02/26/17   Carlisle Cater, PA-C  naproxen (NAPROSYN) 500 MG tablet Take 1 tablet (500 mg total) by mouth 2 (two) times daily. 02/26/17   Carlisle Cater, PA-C  predniSONE (DELTASONE) 20 MG tablet 3 Tabs PO Days 1-3, then 2 tabs PO Days 4-6, then 1 tab PO Day 7-9, then Half Tab PO Day 10-12 02/26/17   Carlisle Cater, PA-C    Family History Family History  Problem Relation Age of Onset  . Cancer Mother  breast  . COPD Mother   . Cancer Sister        breast  . Heart disease Maternal Grandfather     Social History Social History   Tobacco Use  . Smoking status: Current Every Day Smoker  Substance Use Topics  . Alcohol use: No  . Drug use: No     Allergies   Patient has no known allergies.   Review of Systems Review of Systems  Constitutional: Negative for fever and unexpected weight change.  Gastrointestinal: Negative for constipation.       Negative for fecal incontinence.   Genitourinary: Negative for dysuria, flank pain, hematuria, pelvic pain, vaginal  bleeding and vaginal discharge.       Negative for urinary incontinence or retention.  Musculoskeletal: Positive for arthralgias and myalgias. Negative for back pain and joint swelling.  Neurological: Negative for weakness and numbness.       Denies saddle paresthesias.     Physical Exam Updated Vital Signs BP (!) 159/86 (BP Location: Right Arm)   Pulse 74   Temp 98.2 F (36.8 C) (Oral)   Resp 16   Ht 5' 9.5" (1.765 m)   Wt 108.4 kg (239 lb)   SpO2 96%   BMI 34.79 kg/m   Physical Exam  Constitutional: She appears well-developed and well-nourished.  HENT:  Head: Normocephalic and atraumatic.  Eyes: Conjunctivae are normal.  Neck: Normal range of motion. Neck supple.  Cardiovascular:  Pulses:      Dorsalis pedis pulses are 2+ on the right side, and 2+ on the left side.  Pulmonary/Chest: Effort normal.  Abdominal: Soft. There is no tenderness. There is no CVA tenderness.  Musculoskeletal: Normal range of motion.       Right hip: She exhibits tenderness (Minimal). She exhibits normal range of motion, normal strength and no bony tenderness.       Left hip: Normal. She exhibits normal range of motion, normal strength and no tenderness.       Right knee: Normal.       Left knee: Normal.       Right ankle: Normal.       Left ankle: Normal.       Cervical back: Normal. She exhibits normal range of motion, no tenderness and no bony tenderness.       Thoracic back: Normal. She exhibits normal range of motion, no tenderness and no bony tenderness.       Lumbar back: Normal. She exhibits normal range of motion, no tenderness and no bony tenderness.       Right upper leg: She exhibits tenderness (Minimal). She exhibits no bony tenderness and no swelling.       Left upper leg: Normal. She exhibits no tenderness, no bony tenderness and no swelling.  No step-off noted with palpation of spine.   Neurological: She is alert. She has normal strength and normal reflexes. No sensory deficit.    5/5 strength in entire lower extremities bilaterally. No sensation deficit.   Skin: Skin is warm and dry. No rash noted.  Psychiatric: She has a normal mood and affect.  Nursing note and vitals reviewed.    ED Treatments / Results  Labs (all labs ordered are listed, but only abnormal results are displayed) Labs Reviewed - No data to display  EKG  EKG Interpretation None       Radiology Dg Lumbar Spine Complete  Result Date: 02/26/2017 CLINICAL DATA:  Right hip and thigh pain radiating down right leg  for 1 month. No injury. EXAM: LUMBAR SPINE - COMPLETE 4+ VIEW COMPARISON:  None. FINDINGS: Vertebral body alignment and heights are normal. There is mild spondylosis of the lumbar spine to include facet arthropathy. No evidence of compression fracture or spondylolisthesis. There is disc space narrowing at the L4-5 level. IMPRESSION: Mild spondylosis of the lumbar spine with moderate disc space narrowing at the L4-5 level. Electronically Signed   By: Marin Olp M.D.   On: 02/26/2017 13:33   Dg Femur Min 2 Views Right  Result Date: 02/26/2017 CLINICAL DATA:  Right hip and thigh pain worsening over 1 month. No injury. EXAM: RIGHT FEMUR 2 VIEWS COMPARISON:  None. FINDINGS: There is no evidence of fracture or other focal bone lesions. Soft tissues are unremarkable. IMPRESSION: Negative. Electronically Signed   By: Marin Olp M.D.   On: 02/26/2017 13:32    Procedures Procedures (including critical care time)  Medications Ordered in ED Medications - No data to display   Initial Impression / Assessment and Plan / ED Course  I have reviewed the triage vital signs and the nursing notes.  Pertinent labs & imaging results that were available during my care of the patient were reviewed by me and considered in my medical decision making (see chart for details).     Patient seen and examined. Work-up initiated.  Imaging ordered due to duration of symptoms as well as age greater than  57.  Vital signs reviewed and are as follows: Vitals:   02/26/17 0956  BP: (!) 159/86  Pulse: 74  Resp: 16  Temp: 98.2 F (36.8 C)  SpO2: 96%    X-ray results reviewed.  Patient updated on results.  Suspect symptoms likely due to lumbar radiculopathy, possibly at L4/5 level.  No red flag s/s of low back pain. Patient was counseled on back pain precautions and told to do activity as tolerated but do not lift, push, or pull heavy objects more than 10 pounds for the next week.  Patient counseled to use ice or heat on back for no longer than 15 minutes every hour.   Will start on prednisone, naproxen.  Small amount of Vicodin given for severe pain.  Patient encouraged to keep the follow-up appointment with her primary care physician tomorrow.  Patient prescribed narcotic pain medicine and counseled on proper use of narcotic pain medications. Counseled not to combine this medication with others containing tylenol.   Urged patient not to drink alcohol, drive, or perform any other activities that requires focus while taking either of these medications.  Patient urged to follow-up with PCP if pain does not improve with treatment and rest or if pain becomes recurrent. Urged to return with worsening severe pain, loss of bowel or bladder control, trouble walking.   The patient verbalizes understanding and agrees with the plan.    Final Clinical Impressions(s) / ED Diagnoses   Final diagnoses:  Lumbosacral radiculopathy at L4   Patient with back pain.  Imaging shows possible disc compression/narrowing at L4/5.  No significant neurological deficits. Patient is ambulatory. No warning symptoms of back pain including: fecal incontinence, urinary retention or overflow incontinence, night sweats, waking from sleep with back pain, unexplained fevers or weight loss, h/o cancer, IVDU, recent trauma. No concern for cauda equina, epidural abscess, or other serious cause of back pain. Conservative measures  such as rest, ice/heat and pain medicine indicated with PCP follow-up as planned for additional workup.  ED Discharge Orders  Ordered    predniSONE (DELTASONE) 20 MG tablet     02/26/17 1406    HYDROcodone-acetaminophen (NORCO/VICODIN) 5-325 MG tablet     02/26/17 1406    naproxen (NAPROSYN) 500 MG tablet  2 times daily     02/26/17 1406       Carlisle Cater, PA-C 02/26/17 1430    Carlisle Cater, PA-C 02/26/17 1431    Duffy Bruce, MD 02/26/17 [phone]

## 2017-02-26 NOTE — ED Notes (Signed)
Pt does not want pain meds at this time.  She states pain only with ambulation.

## 2017-02-27 ENCOUNTER — Ambulatory Visit: Payer: 59 | Admitting: Family Medicine

## 2017-02-27 ENCOUNTER — Encounter: Payer: Self-pay | Admitting: Family Medicine

## 2017-02-27 VITALS — BP 160/80 | HR 98 | Temp 98.8°F | Resp 18 | Ht 69.0 in | Wt 241.0 lb

## 2017-02-27 DIAGNOSIS — M5431 Sciatica, right side: Secondary | ICD-10-CM | POA: Diagnosis not present

## 2017-02-27 MED ORDER — CYCLOBENZAPRINE HCL 10 MG PO TABS: 10.0000 mg | ORAL_TABLET | Freq: Three times a day (TID) | ORAL | 0 refills | Status: DC | PRN

## 2017-02-27 MED ORDER — HYDROCODONE-ACETAMINOPHEN 5-325 MG PO TABS: ORAL_TABLET | ORAL | 0 refills | Status: DC

## 2017-02-27 NOTE — Progress Notes (Signed)
Subjective:    Patient ID: Crystal Clay, female    DOB: [birth year], 52 y.o.   MRN: [id number]  HPI Symptoms began 4 weeks ago.  Pain began in the right side of her lower back radiating around the lateral aspect of her right hip into her right thigh.  Went to an urgent care where she received a shot of Depo-Medrol which helped slightly and was started on diclofenac.  Symptoms have gradually worsened to the point the patient went to the emergency room recently.  X-ray in the emergency room revealed degenerative disc disease with disc space narrowing most pronounced at L4-L5.  She is started on prednisone and hydrocodone.  Pain continues to worsen.  Patient can barely stand today because of the pain.  She reports numbness and tingling radiating down her right leg into her right foot.  She denies any saddle anesthesia.  She denies any bowel or bladder incontinence.  She denies any leg weakness although she can barely stand due to the pain despite steroid injection, oral prednisone, diclofenac, naproxen, and hydrocodone and having on both to the urgent care and the emergency room over the last 4 weeks Past Medical History:  Diagnosis Date  . Anemia   . Multinodular goiter   . Smoker    No past surgical history on file. Current Outpatient Medications on File Prior to Visit  Medication Sig Dispense Refill  . albuterol (PROVENTIL HFA;VENTOLIN HFA) 108 (90 Base) MCG/ACT inhaler Inhale 2 puffs into the lungs every 6 (six) hours as needed for wheezing or shortness of breath. 1 Inhaler 0  . aspirin-acetaminophen-caffeine (EXCEDRIN MIGRAINE) 250-250-65 MG tablet Take 2 tablets by mouth every 6 (six) hours as needed for headache.    . Menthol-Methyl Salicylate (MUSCLE RUB) 10-15 % CREA Apply 1 application topically as needed for muscle pain.    . naproxen (NAPROSYN) 500 MG tablet Take 1 tablet (500 mg total) by mouth 2 (two) times daily. 20 tablet 0  . naproxen sodium (ALEVE) 220 MG tablet Take 440 mg by  mouth as needed (pain).    . predniSONE (DELTASONE) 20 MG tablet 3 Tabs PO Days 1-3, then 2 tabs PO Days 4-6, then 1 tab PO Day 7-9, then Half Tab PO Day 10-12 20 tablet 0   No current facility-administered medications on file prior to visit.    No Known Allergies Social History   Socioeconomic History  . Marital status: Divorced    Spouse name: Not on file  . Number of children: Not on file  . Years of education: Not on file  . Highest education level: Not on file  Social Needs  . Financial resource strain: Not on file  . Food insecurity - worry: Not on file  . Food insecurity - inability: Not on file  . Transportation needs - medical: Not on file  . Transportation needs - non-medical: Not on file  Occupational History  . Not on file  Tobacco Use  . Smoking status: Current Every Day Smoker  . Smokeless tobacco: Never Used  Substance and Sexual Activity  . Alcohol use: No  . Drug use: No  . Sexual activity: Not on file    Comment: divorced, 1 son, Web designer  Other Topics Concern  . Not on file  Social History Narrative  . Not on file      Review of Systems  All other systems reviewed and are negative.      Objective:   Physical Exam  Constitutional: She appears  distressed.  Cardiovascular: Normal rate, regular rhythm and normal heart sounds.  Pulmonary/Chest: Effort normal and breath sounds normal.  Musculoskeletal:       Lumbar back: She exhibits decreased range of motion, tenderness, pain and spasm. She exhibits no bony tenderness.          Assessment & Plan:  Right sided sciatica - Plan: MR Lumbar Spine Wo Contrast  Proceed with MRI of the lumbar spine.  Symptoms are progressing and worsening over the last 4 weeks despite conservative therapy.  Symptoms are worse today than they were 4 weeks ago.  Finished prednisone taper pack.  Use hydrocodone 5/325 1 every 6 hours as needed for pain.  Add Flexeril 10 mg every 8 hours as need some muscle  spasms which she is having in her lower back and in her thigh.

## 2017-03-06 ENCOUNTER — Telehealth: Payer: Self-pay | Admitting: Family Medicine

## 2017-03-06 NOTE — Telephone Encounter (Signed)
Pt called and LMOVM stating that she needs a letter from you for work stating that she needs a new chair for work that is ergonomic. They will get her a new chair if she has a letter from her doctor.   CB# W - [phone] C - [phone]

## 2017-03-10 ENCOUNTER — Encounter: Payer: Self-pay | Admitting: Family Medicine

## 2017-03-10 NOTE — Telephone Encounter (Signed)
Called made pt aware letter is ready and she would like it faxed to her at [phone] - letter faxed

## 2017-03-10 NOTE — Telephone Encounter (Signed)
Letter in chart

## 2017-03-13 ENCOUNTER — Other Ambulatory Visit: Payer: Self-pay | Admitting: Family Medicine

## 2017-03-13 MED ORDER — HYDROCODONE-ACETAMINOPHEN 5-325 MG PO TABS: ORAL_TABLET | ORAL | 0 refills | Status: DC

## 2017-03-13 NOTE — Telephone Encounter (Signed)
Patient is requesting a refill on Hydrocodone - she states that her knee is still hurting pretty bad and we are awaiting the MRI.

## 2017-03-16 ENCOUNTER — Ambulatory Visit
Admission: RE | Admit: 2017-03-16 | Discharge: 2017-03-16 | Disposition: A | Discharge status: Home / Self Care | Payer: 59 | Source: Ambulatory Visit | Attending: Family Medicine | Admitting: Family Medicine

## 2017-03-16 DIAGNOSIS — M5431 Sciatica, right side: Secondary | ICD-10-CM

## 2017-03-16 DIAGNOSIS — M48061 Spinal stenosis, lumbar region without neurogenic claudication: Secondary | ICD-10-CM | POA: Diagnosis not present

## 2017-03-19 ENCOUNTER — Other Ambulatory Visit: Payer: Self-pay | Admitting: Family Medicine

## 2017-03-19 DIAGNOSIS — M5416 Radiculopathy, lumbar region: Secondary | ICD-10-CM

## 2017-04-22 DIAGNOSIS — M5126 Other intervertebral disc displacement, lumbar region: Secondary | ICD-10-CM | POA: Diagnosis not present

## 2017-06-03 DIAGNOSIS — R030 Elevated blood-pressure reading, without diagnosis of hypertension: Secondary | ICD-10-CM | POA: Diagnosis not present

## 2017-06-03 DIAGNOSIS — M5126 Other intervertebral disc displacement, lumbar region: Secondary | ICD-10-CM | POA: Diagnosis not present

## 2017-10-22 ENCOUNTER — Ambulatory Visit: Payer: 59 | Admitting: Physician Assistant

## 2017-10-23 ENCOUNTER — Encounter: Payer: Self-pay | Admitting: Family Medicine

## 2017-10-23 ENCOUNTER — Ambulatory Visit: Payer: 59 | Admitting: Family Medicine

## 2017-10-23 ENCOUNTER — Other Ambulatory Visit: Payer: Self-pay

## 2017-10-23 VITALS — BP 126/70 | HR 78 | Temp 98.3°F | Resp 14 | Ht 69.0 in | Wt 229.0 lb

## 2017-10-23 DIAGNOSIS — R1084 Generalized abdominal pain: Secondary | ICD-10-CM

## 2017-10-23 DIAGNOSIS — R21 Rash and other nonspecific skin eruption: Secondary | ICD-10-CM

## 2017-10-23 DIAGNOSIS — R202 Paresthesia of skin: Secondary | ICD-10-CM

## 2017-10-23 DIAGNOSIS — K5900 Constipation, unspecified: Secondary | ICD-10-CM | POA: Diagnosis not present

## 2017-10-23 LAB — URINALYSIS, ROUTINE W REFLEX MICROSCOPIC
BACTERIA UA: NONE SEEN /HPF
Bilirubin Urine: NEGATIVE
Glucose, UA: NEGATIVE
KETONES UR: NEGATIVE
Leukocytes, UA: NEGATIVE
Nitrite: NEGATIVE
PROTEIN: NEGATIVE
Specific Gravity, Urine: 1.02 (ref 1.001–1.03)
WBC UA: NONE SEEN /HPF (ref 0–5)
pH: 7 (ref 5.0–8.0)

## 2017-10-23 LAB — MICROSCOPIC MESSAGE

## 2017-10-23 MED ORDER — DICYCLOMINE HCL 20 MG PO TABS: 20.0000 mg | ORAL_TABLET | Freq: Three times a day (TID) | ORAL | 0 refills | Status: DC

## 2017-10-23 MED ORDER — POLYETHYLENE GLYCOL 3350 17 GM/SCOOP PO POWD
17.0000 g | Freq: Every day | ORAL | 1 refills | Status: DC
Start: 2017-10-23 — End: 2021-09-28

## 2017-10-23 NOTE — Progress Notes (Signed)
Patient ID: Crystal Clay, female    DOB: 1965/06/22, 52 y.o.   MRN: [id number]  PCP: Susy Frizzle, MD  Chief Complaint  Patient presents with  . Abdominal Pain    x3 days- pain in L side of abd that radiates to back, also has some loss of sensation to skin on L side     Subjective:   Crystal Clay is a 52 y.o. female, presents to clinic with CC of Monday she felt her somach itching nawing hurting started with pain, itching burning, she originally thought it was constipation  Or gas pains, but then her skin became numb.  The next day her skin became numb, has had some tingling or funny sensation, has a deep burning hard cramp throbbing  Normal BM yesterday, no straining with bm yesterday, had a little feeling and a little gas   Patient Active Problem List   Diagnosis Date Noted  . Smoker      Prior to Admission medications   Medication Sig Start Date End Date Taking? Authorizing Provider  aspirin-acetaminophen-caffeine (EXCEDRIN MIGRAINE) [phone] MG tablet Take 2 tablets by mouth every 6 (six) hours as needed for headache.   Yes [provider]     No Known Allergies   Family History  Problem Relation Age of Onset  . Cancer Mother        breast  . COPD Mother   . Cancer Sister        breast  . Heart disease Maternal Grandfather      Social History   Socioeconomic History  . Marital status: Divorced    Spouse name: Not on file  . Number of children: Not on file  . Years of education: Not on file  . Highest education level: Not on file  Occupational History  . Not on file  Social Needs  . Financial resource strain: Not on file  . Food insecurity:    Worry: Not on file    Inability: Not on file  . Transportation needs:    Medical: Not on file    Non-medical: Not on file  Tobacco Use  . Smoking status: Current Every Day Smoker  . Smokeless tobacco: Never Used  Substance and Sexual Activity  . Alcohol use: No  . Drug use: No  .  Sexual activity: Not on file    Comment: divorced, 1 son, Web designer  Lifestyle  . Physical activity:    Days per week: Not on file    Minutes per session: Not on file  . Stress: Not on file  Relationships  . Social connections:    Talks on phone: Not on file    Gets together: Not on file    Attends religious service: Not on file    Active member of club or organization: Not on file    Attends meetings of clubs or organizations: Not on file    Relationship status: Not on file  . Intimate partner violence:    Fear of current or ex partner: Not on file    Emotionally abused: Not on file    Physically abused: Not on file    Forced sexual activity: Not on file  Other Topics Concern  . Not on file  Social History Narrative  . Not on file     Review of Systems  Constitutional: Negative.   HENT: Negative.   Eyes: Negative.   Respiratory: Negative.   Cardiovascular: Negative.   Gastrointestinal: Negative.   Endocrine:  Negative.   Genitourinary: Negative.   Musculoskeletal: Negative.   Skin: Negative.   Allergic/Immunologic: Negative.   Neurological: Negative.   Hematological: Negative.   Psychiatric/Behavioral: Negative.   All other systems reviewed and are negative.      Objective:    Vitals:   10/23/17 1610  BP: 126/70  Pulse: 78  Resp: 14  Temp: 98.3 F (36.8 C)  TempSrc: Oral  SpO2: 98%  Weight: 229 lb (103.9 kg)  Height: 5\' 9"  (1.753 m)      Physical Exam  Constitutional: She appears well-developed and well-nourished. No distress.  HENT:  Head: Normocephalic and atraumatic.  Nose: Nose normal.  Mouth/Throat: Oropharynx is clear and moist. No oropharyngeal exudate.  Eyes: Pupils are equal, round, and reactive to light. Conjunctivae and EOM are normal. Right eye exhibits no discharge. Left eye exhibits no discharge. No scleral icterus.  Neck: Normal range of motion. No tracheal deviation present.  Cardiovascular: Normal rate, regular  rhythm, normal heart sounds and intact distal pulses. Exam reveals no gallop and no friction rub.  No murmur heard. Pulmonary/Chest: Effort normal and breath sounds normal. No stridor. No respiratory distress. She has no wheezes. She has no rales.  Abdominal: Soft. Bowel sounds are normal. She exhibits no distension and no mass. There is no tenderness. There is no rebound and no guarding.  Musculoskeletal: Normal range of motion.  Neurological: She is alert. She exhibits normal muscle tone. Coordination normal.  Skin: Skin is warm and dry. Rash noted. She is not diaphoretic.  Small cluster of 4 mildly erythematous papules to left side of abdomen, no other rash, no skin edema, induration or other areas of erythema.  Some mildly dry and excoriated areas of skin folds.  No skin ttp  Psychiatric: She has a normal mood and affect. Her behavior is normal. Judgment and thought content normal.  Nursing note and vitals reviewed.         Assessment & Plan:      ICD-10-CM   1. Generalized abdominal pain R10.84 dicyclomine (BENTYL) 20 MG tablet    DG Abd 1 View    Urinalysis, Routine w reflex microscopic    Microscopic Message  2. Constipation, unspecified constipation type K59.00 polyethylene glycol powder (GLYCOLAX/MIRALAX) powder    DG Abd 1 View  3. Paresthesias R20.2   4. Rash and nonspecific skin eruption R21 valACYclovir (VALTREX) 1000 MG tablet    HYDROcodone-acetaminophen (NORCO/VICODIN) 5-325 MG tablet    Pt with interesting presentation of 4 days of gradually worsening abdominal discomfort with some constipation and gassiness, but also abdominal skin sensitivity, burning, tingling and numbness.  She feels the skin is swollen but I do not see any asymmetry of abd and there is no edema or induration of the abdominal skin.  She has large, obese abdomen, normal BS and no TTP, no peritoneal signs, no CVA tenderness.  Only abnormality on abdomen is small area of rash.  Her sx, distribution and  rash may be early shingles, but pt states she never had chicken pox.    No colonoscopy done before.  No abd surgeries   Plan is to tx constipation, bentyl for abd cramping.  Topical hydrocortisone for areas of skin that are bothersome.  If steroids worsen skin then may consider tinea, but currently rash is very non-specific.  If abd does not improve, discussed KUB.    Currently pt has no concerning infectious sx, she is well appearing, Hx and exam appear benign and I do not feel  we need to do labs or CT.  Wait and see.  Pt to return if any worsening.  Pt to notify me with any change to skin/rash.   Delsa Grana, PA-C 10/23/17 4:18 PM   10/25/17 9:17 AM Pt paged oncall provider and reports rash has spread from left side to back only of the left side, likely shingles.  Valtrex 1000 mg TID x 7 d and pain meds sent to her pharmacy

## 2017-10-23 NOTE — Patient Instructions (Addendum)
Drink a lot of fluid, do the stool softener - if doesn't get better - abdominal xray  If It gets worse please come back for recheck  For the skin - call me if you develop a rash - may be shingles?  And I would call you in the meds for it.  For the skin - any irritated areas or where there are a few bumps, apply some topical hydrocortisone cream 2x a day with your other normal skin care   Abdominal Pain, Adult Abdominal pain can be caused by many things. Often, abdominal pain is not serious and it gets better with no treatment or by being treated at home. However, sometimes abdominal pain is serious. Your health care provider will do a medical history and a physical exam to try to determine the cause of your abdominal pain. Follow these instructions at home:  Take over-the-counter and prescription medicines only as told by your health care provider. Do not take a laxative unless told by your health care provider.  Drink enough fluid to keep your urine clear or pale yellow.  Watch your condition for any changes.  Keep all follow-up visits as told by your health care provider. This is important. Contact a health care provider if:  Your abdominal pain changes or gets worse.  You are not hungry or you lose weight without trying.  You are constipated or have diarrhea for more than 2-3 days.  You have pain when you urinate or have a bowel movement.  Your abdominal pain wakes you up at night.  Your pain gets worse with meals, after eating, or with certain foods.  You are throwing up and cannot keep anything down.  You have a fever. Get help right away if:  Your pain does not go away as soon as your health care provider told you to expect.  You cannot stop throwing up.  Your pain is only in areas of the abdomen, such as the right side or the left lower portion of the abdomen.  You have bloody or black stools, or stools that look like tar.  You have severe pain, cramping, or  bloating in your abdomen.  You have signs of dehydration, such as: ? Dark urine, very little urine, or no urine. ? Cracked lips. ? Dry mouth. ? Sunken eyes. ? Sleepiness. ? Weakness. This information is not intended to replace advice given to you by your health care provider. Make sure you discuss any questions you have with your health care provider. Document Released: 11/07/2004 Document Revised: 08/18/2015 Document Reviewed: 07/12/2015 Elsevier Interactive Patient Education  [employer].

## 2017-10-25 MED ORDER — HYDROCODONE-ACETAMINOPHEN 5-325 MG PO TABS: 1.0000 | ORAL_TABLET | Freq: Three times a day (TID) | ORAL | 0 refills | Status: AC | PRN

## 2017-10-25 MED ORDER — VALACYCLOVIR HCL 1 G PO TABS: 1000.0000 mg | ORAL_TABLET | Freq: Three times a day (TID) | ORAL | 0 refills | Status: AC

## 2017-12-05 ENCOUNTER — Ambulatory Visit (INDEPENDENT_AMBULATORY_CARE_PROVIDER_SITE_OTHER): Payer: 59

## 2017-12-05 DIAGNOSIS — Z23 Encounter for immunization: Secondary | ICD-10-CM

## 2017-12-05 NOTE — Progress Notes (Signed)
Patient was in office to receive her flu vaccine.Patient received the vaccine in her left deltoid patient tolerated well

## 2018-01-15 ENCOUNTER — Other Ambulatory Visit: Payer: Self-pay | Admitting: Family Medicine

## 2018-01-15 DIAGNOSIS — Z1231 Encounter for screening mammogram for malignant neoplasm of breast: Secondary | ICD-10-CM

## 2018-02-09 ENCOUNTER — Ambulatory Visit (HOSPITAL_COMMUNITY)
Admission: RE | Admit: 2018-02-09 | Discharge: 2018-02-09 | Disposition: A | Discharge status: Home / Self Care | Payer: 59 | Source: Ambulatory Visit | Attending: Family Medicine | Admitting: Family Medicine

## 2018-02-09 DIAGNOSIS — Z1231 Encounter for screening mammogram for malignant neoplasm of breast: Secondary | ICD-10-CM | POA: Insufficient documentation

## 2018-10-07 IMAGING — MR MR LUMBAR SPINE W/O CM
5 series · 48 of 48 positions shown · non-contrast
Comparison: Plain films 02/26/2017.

CLINICAL DATA: Low back pain.  RIGHT leg pain.

EXAM:
MRI LUMBAR SPINE WITHOUT CONTRAST
TECHNIQUE: Multiplanar, multisequence MR imaging of the lumbar spine was
performed. No intravenous contrast was administered.

[Series 6: T2 · axial · 4.0mm · 0.70mm/px · z∈[-1,+205]mm · 15 of 40 slices shown (1 of 2)]
[im 1/40]
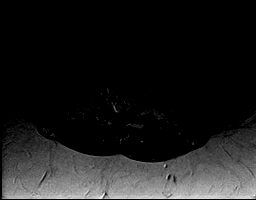
[im 3/40]
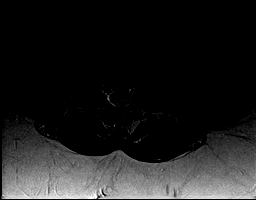
[im 6/40]
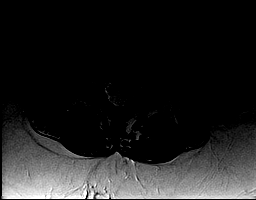
[im 9/40]
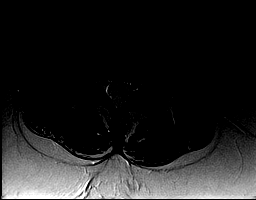
[im 12/40]
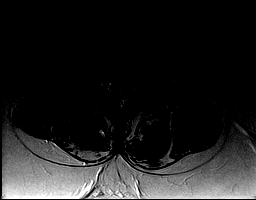
[im 14/40]
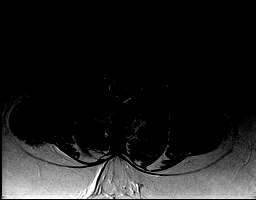
[im 17/40]
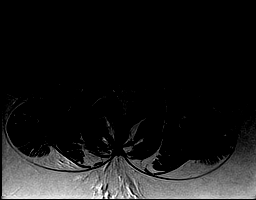
[im 20/40]
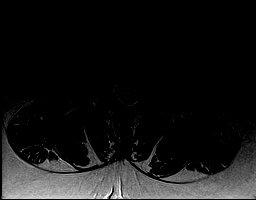
[im 23/40]
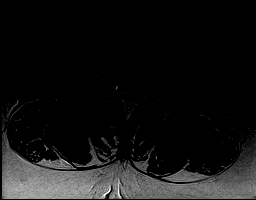
[im 26/40]
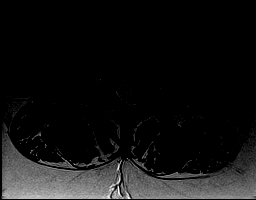
[im 28/40]
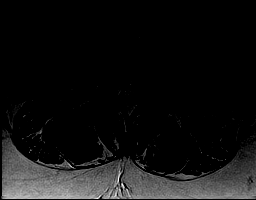
[im 31/40]
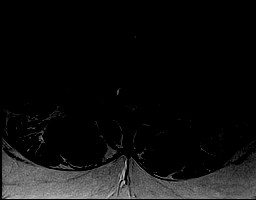
[im 34/40]
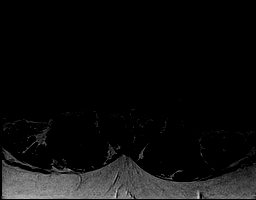
[im 37/40]
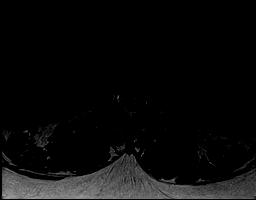
[im 40/40]
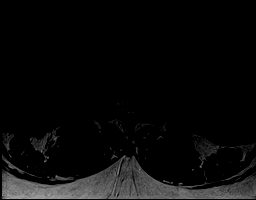

[Series 7: T1 · axial · 4.0mm · 0.70mm/px · z∈[-5,+207]mm · 15 of 40 slices shown (1 of 2)]
[im 1/40]
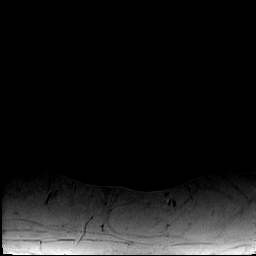
[im 3/40]
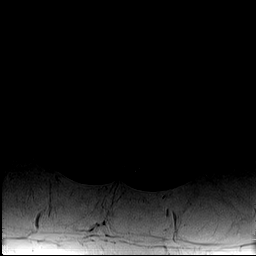
[im 6/40]
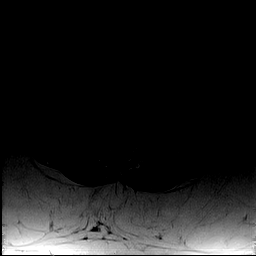
[im 9/40]
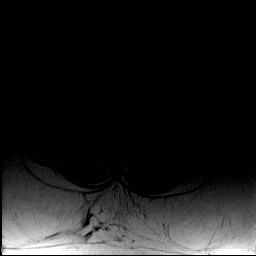
[im 12/40]
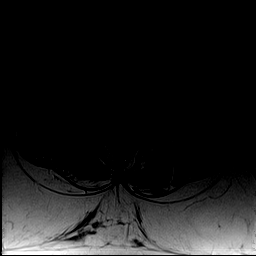
[im 14/40]
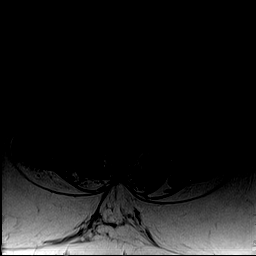
[im 17/40]
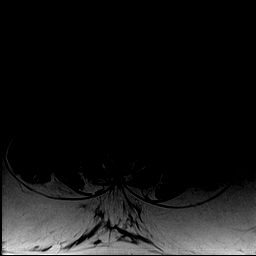
[im 20/40]
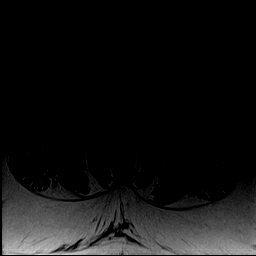
[im 23/40]
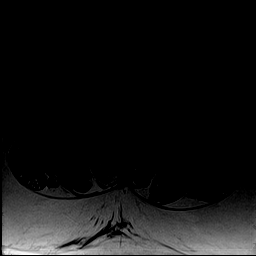
[im 26/40]
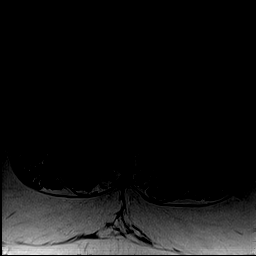
[im 28/40]
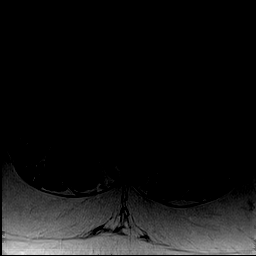
[im 31/40]
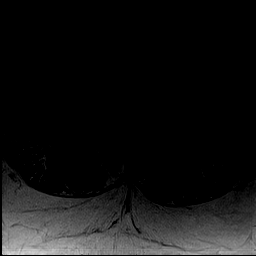
[im 34/40]
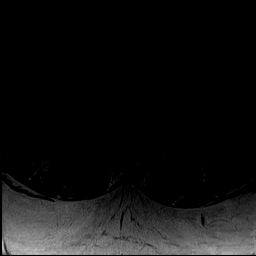
[im 37/40]
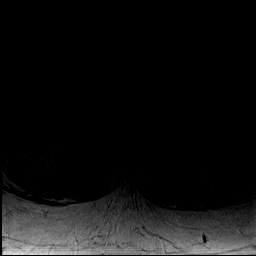
[im 40/40]
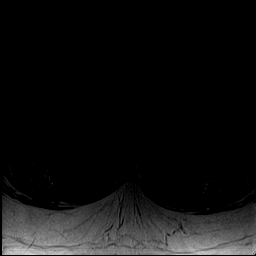

[Series 8: T2 · sagittal · 4.0mm · 0.80mm/px · 6 of 15 slices shown (2 of 2)]
[im 1/15]
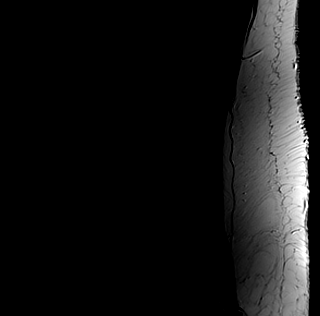
[im 3/15]
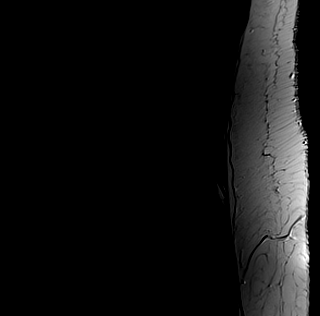
[im 6/15]
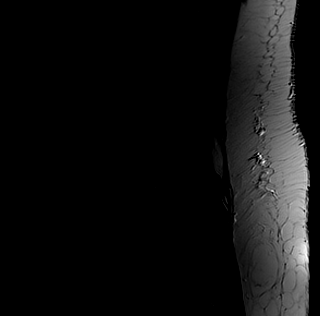
[im 9/15]
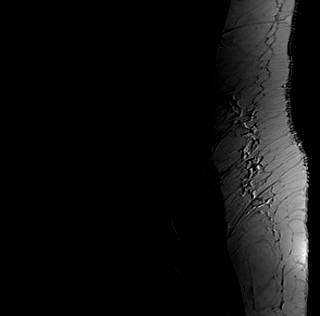
[im 12/15]
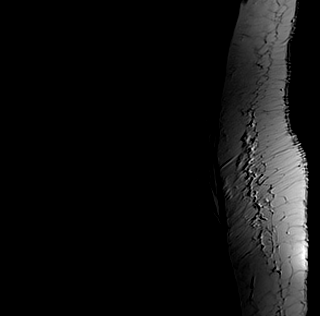
[im 15/15]
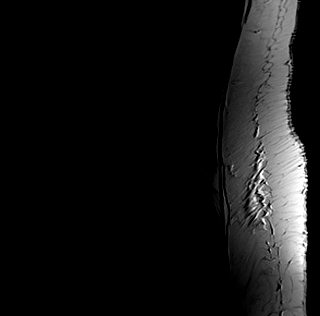

[Series 9: STIR · sagittal · 4.0mm · 0.80mm/px · 6 of 15 slices shown]
[im 1/15]
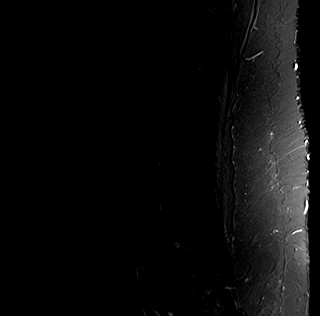
[im 3/15]
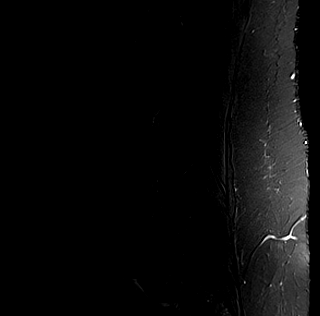
[im 6/15]
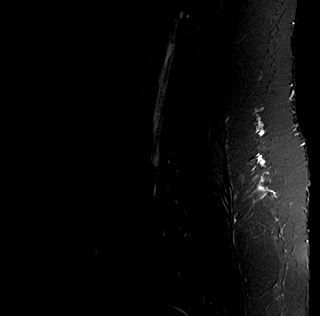
[im 9/15]
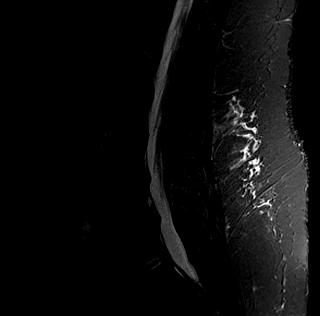
[im 12/15]
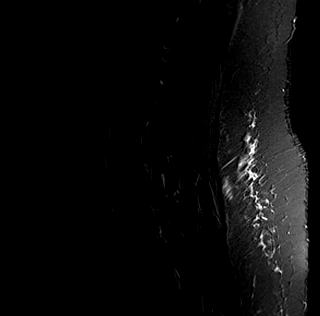
[im 15/15]
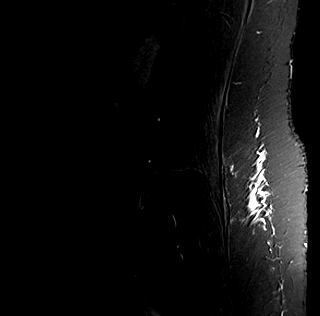

[Series 10: T1 · sagittal · 4.0mm · 0.80mm/px · 6 of 15 slices shown (2 of 2)]
[im 1/15]
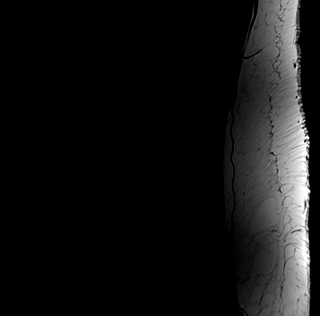
[im 3/15]
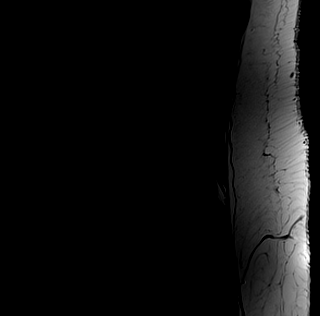
[im 6/15]
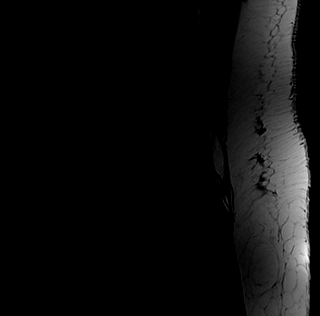
[im 9/15]
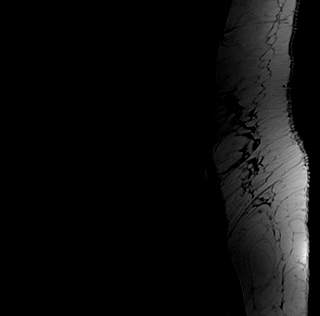
[im 12/15]
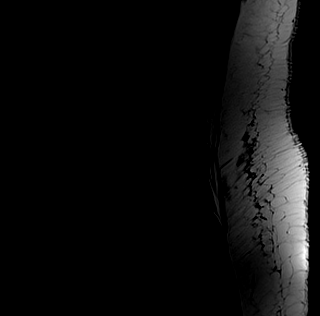
[im 15/15]
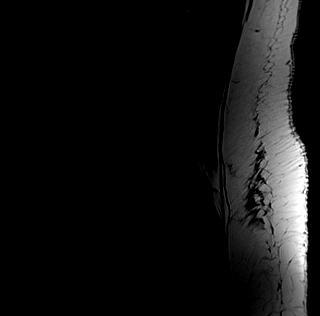

[48 of 48 positions shown; findings below may reference images not displayed]

FINDINGS: Segmentation:  Standard.

Alignment:  Physiologic.

Vertebrae: Modic type 2 changes L4-5, related to chronic disc space
narrowing.

Conus medullaris and cauda equina: Conus extends to the L1 level.
Conus and cauda equina appear normal.

Paraspinal and other soft tissues: Increased body habitus without
signs of epidural lipomatosis.

Disc levels:

L1-L2:  Normal.

L2-L3:  Normal.

L3-L4: Slight disc space narrowing. Far-lateral and
foraminal/extraforaminal protrusion on the RIGHT. Mild facet
arthropathy. No subarticular zone narrowing, but moderate to severe
impingement of the RIGHT L3 nerve root

L4-L5: Chronic loss of interspace height, greater on the LEFT. Large
disc extrusion, central and to the LEFT associated with facet
arthropathy. Mild stenosis. Severe LEFT L5 nerve root impingement in
the subarticular zone. Mild foraminal narrowing could affect the
LEFT L4 nerve root, but not to a significant degree.

L5-S1: Unremarkable disc space. Mild facet arthropathy no
impingement.
IMPRESSION: The dominant RIGHT-sided abnormality is at L3-L4, where a
far-lateral and foraminal/extraforaminal protrusion/extrusion
results in RIGHT L3 nerve root impingement.

Large disc extrusion at L4-5 on the LEFT. Severe LEFT L5 nerve root
impingement. Mild stenosis. Chronic endplate reactive changes.

## 2018-10-08 ENCOUNTER — Other Ambulatory Visit: Payer: Self-pay

## 2018-10-09 ENCOUNTER — Ambulatory Visit (INDEPENDENT_AMBULATORY_CARE_PROVIDER_SITE_OTHER): Payer: 59 | Admitting: Family Medicine

## 2018-10-09 ENCOUNTER — Encounter: Payer: Self-pay | Admitting: Family Medicine

## 2018-10-09 VITALS — BP 126/76 | HR 88 | Temp 98.1°F | Resp 18 | Ht 69.0 in | Wt 243.0 lb

## 2018-10-09 DIAGNOSIS — Z23 Encounter for immunization: Secondary | ICD-10-CM

## 2018-10-09 DIAGNOSIS — Z1159 Encounter for screening for other viral diseases: Secondary | ICD-10-CM | POA: Diagnosis not present

## 2018-10-09 DIAGNOSIS — Z124 Encounter for screening for malignant neoplasm of cervix: Secondary | ICD-10-CM

## 2018-10-09 DIAGNOSIS — Z114 Encounter for screening for human immunodeficiency virus [HIV]: Secondary | ICD-10-CM

## 2018-10-09 DIAGNOSIS — Z0000 Encounter for general adult medical examination without abnormal findings: Secondary | ICD-10-CM

## 2018-10-09 DIAGNOSIS — F17200 Nicotine dependence, unspecified, uncomplicated: Secondary | ICD-10-CM | POA: Diagnosis not present

## 2018-10-09 DIAGNOSIS — Z0001 Encounter for general adult medical examination with abnormal findings: Secondary | ICD-10-CM | POA: Diagnosis not present

## 2018-10-09 DIAGNOSIS — Z1211 Encounter for screening for malignant neoplasm of colon: Secondary | ICD-10-CM

## 2018-10-09 NOTE — Addendum Note (Signed)
Addended by: Shary Decamp B on: 10/09/2018 12:16 PM   Modules accepted: Orders

## 2018-10-09 NOTE — Progress Notes (Signed)
Subjective:    Patient ID: Crystal Clay, female    DOB: 10/11/1965, 53 y.o.   MRN: [id number]  HPI  Patient is here today for complete physical exam.  Mammogram was performed in 01/2018 and was normal.  Overdue for pap smear and colonoscopy.  Due for HIV and hep C screening.  Patient is also due for her flu shot.  She continues to smoke and therefore is due for Pneumovax 23.  She also has a history of a multinodular goiter confirmed by ultrasound at her last physical.  She has not scheduled a follow-up for this.  However otherwise she is doing well with no concerns.  Last year she was dealing with a tremendous amount of pain due to lumbar radiculopathy.  This is finally improved.  Past Medical History:  Diagnosis Date  . Anemia   . Multinodular goiter   . Smoker    No past surgical history on file. Current Outpatient Medications on File Prior to Visit  Medication Sig Dispense Refill  . aspirin-acetaminophen-caffeine (EXCEDRIN MIGRAINE) 250-250-65 MG tablet Take 2 tablets by mouth every 6 (six) hours as needed for headache.    . dicyclomine (BENTYL) 20 MG tablet Take 1 tablet (20 mg total) by mouth 4 (four) times daily -  before meals and at bedtime. 30 tablet 0  . polyethylene glycol powder (GLYCOLAX/MIRALAX) powder Take 17 g by mouth daily. 3350 g 1   No current facility-administered medications on file prior to visit.    No Known Allergies Social History   Socioeconomic History  . Marital status: Divorced    Spouse name: Not on file  . Number of children: Not on file  . Years of education: Not on file  . Highest education level: Not on file  Occupational History  . Not on file  Social Needs  . Financial resource strain: Not on file  . Food insecurity    Worry: Not on file    Inability: Not on file  . Transportation needs    Medical: Not on file    Non-medical: Not on file  Tobacco Use  . Smoking status: Current Every Day Smoker  . Smokeless tobacco: Never Used   Substance and Sexual Activity  . Alcohol use: No  . Drug use: No  . Sexual activity: Not on file    Comment: divorced, 1 son, Web designer  Lifestyle  . Physical activity    Days per week: Not on file    Minutes per session: Not on file  . Stress: Not on file  Relationships  . Social Herbalist on phone: Not on file    Gets together: Not on file    Attends religious service: Not on file    Active member of club or organization: Not on file    Attends meetings of clubs or organizations: Not on file    Relationship status: Not on file  . Intimate partner violence    Fear of current or ex partner: Not on file    Emotionally abused: Not on file    Physically abused: Not on file    Forced sexual activity: Not on file  Other Topics Concern  . Not on file  Social History Narrative  . Not on file   Family History  Problem Relation Age of Onset  . Cancer Mother        breast  . COPD Mother   . Cancer Sister        breast  .  Heart disease Maternal Grandfather      Review of Systems  All other systems reviewed and are negative.      Objective:   Physical Exam  Constitutional: She is oriented to person, place, and time. She appears well-developed and well-nourished. No distress.  HENT:  Head: Normocephalic and atraumatic.  Right Ear: External ear normal.  Left Ear: External ear normal.  Nose: Nose normal.  Mouth/Throat: Oropharynx is clear and moist. No oropharyngeal exudate.  Eyes: Pupils are equal, round, and reactive to light. Conjunctivae and EOM are normal. Right eye exhibits no discharge. Left eye exhibits no discharge. No scleral icterus.  Neck: Normal range of motion. Neck supple. No JVD present. No tracheal deviation present.  Cardiovascular: Normal rate, regular rhythm, normal heart sounds and intact distal pulses. Exam reveals no gallop and no friction rub.  No murmur heard. Pulmonary/Chest: Effort normal and breath sounds normal. No  stridor. No respiratory distress. She has no wheezes. She has no rales. She exhibits no tenderness.  Abdominal: Soft. Bowel sounds are normal. She exhibits no distension and no mass. There is no abdominal tenderness. There is no rebound and no guarding.  Genitourinary:    Vagina and uterus normal.  There is no rash, tenderness or lesion on the right labia. There is no rash, tenderness or lesion on the left labia. Cervix exhibits no motion tenderness and no friability.    No vaginal discharge.   Musculoskeletal: Normal range of motion.        General: No tenderness or edema.  Lymphadenopathy:    She has no cervical adenopathy.  Neurological: She is alert and oriented to person, place, and time. She has normal reflexes. No cranial nerve deficit. She exhibits normal muscle tone. Coordination normal.  Skin: Skin is warm. No rash noted. She is not diaphoretic. No erythema. No pallor.  Psychiatric: She has a normal mood and affect. Her behavior is normal. Judgment and thought content normal.  Vitals reviewed.         Assessment & Plan:   General medical exam - Plan: CBC with Differential/Platelet, COMPLETE METABOLIC PANEL WITH GFR, Lipid panel  Smoker  Encounter for hepatitis C screening test for low risk patient - Plan: Hepatitis C Antibody  Screening for HIV (human immunodeficiency virus) - Plan: HIV Antibody (routine testing w rflx)  Cervical cancer screening - Plan: PAP, Thin Prep w/HPV rflx HPV Type 16/18  Colon cancer screening - Plan: Ambulatory referral to Gastroenterology  Strongly recommended the patient quit smoking.  Meanwhile she received her flu shot and Pneumovax 23 today.  I will screen the patient for hepatitis C.  I will screen the patient for HIV.  Her Pap smear was performed and sent to pathology.  Pelvic exam was normal.  I will schedule the patient to meet with a gastroenterologist for a colonoscopy.  Meanwhile I will check a CBC, CMP, and fasting lipid panel.   Regular anticipatory guidance is provided.

## 2018-10-12 LAB — LIPID PANEL
Cholesterol: 176 mg/dL (ref <200)
HDL: 43 mg/dL — ABNORMAL LOW (ref >=50)
LDL Cholesterol (Calc): 115 mg/dL (calc) — ABNORMAL HIGH
Non-HDL Cholesterol (Calc): 133 mg/dL (calc) — ABNORMAL HIGH (ref <130)
Total CHOL/HDL Ratio: 4.1 (calc) (ref <5.0)
Triglycerides: 85 mg/dL (ref <150)

## 2018-10-12 LAB — HIV ANTIBODY (ROUTINE TESTING W REFLEX): HIV 1&2 Ab, 4th Generation: NONREACTIVE

## 2018-10-12 LAB — COMPLETE METABOLIC PANEL WITH GFR
AG Ratio: 1.4 (calc) (ref 1.0–2.5)
ALT: 13 U/L (ref 6–29)
AST: 15 U/L (ref 10–35)
Albumin: 4.2 g/dL (ref 3.6–5.1)
Alkaline phosphatase (APISO): 124 U/L (ref 37–153)
BUN: 8 mg/dL (ref 7–25)
CO2: 26 mmol/L (ref 20–32)
Calcium: 9.3 mg/dL (ref 8.6–10.4)
Chloride: 104 mmol/L (ref 98–110)
Creat: 0.62 mg/dL (ref 0.50–1.05)
GFR, Est African American: 119 mL/min/{1.73_m2} (ref >=60)
GFR, Est Non African American: 103 mL/min/{1.73_m2} (ref >=60)
Globulin: 2.9 g/dL (calc) (ref 1.9–3.7)
Glucose, Bld: 86 mg/dL (ref 65–99)
Potassium: 4.5 mmol/L (ref 3.5–5.3)
Sodium: 140 mmol/L (ref 135–146)
Total Bilirubin: 0.4 mg/dL (ref 0.2–1.2)
Total Protein: 7.1 g/dL (ref 6.1–8.1)

## 2018-10-12 LAB — PAP, TP IMAGING W/ HPV RNA, RFLX HPV TYPE 16,18/45: HPV DNA High Risk: NOT DETECTED

## 2018-10-12 LAB — CBC WITH DIFFERENTIAL/PLATELET
Absolute Monocytes: 477 cells/uL (ref 200–950)
Basophils Absolute: 32 cells/uL (ref 0–200)
Basophils Relative: 0.6 %
Eosinophils Absolute: 90 cells/uL (ref 15–500)
Eosinophils Relative: 1.7 %
HCT: 41.9 % (ref 35.0–45.0)
Hemoglobin: 14 g/dL (ref 11.7–15.5)
Lymphs Abs: 2677 cells/uL (ref 850–3900)
MCH: 31.1 pg (ref 27.0–33.0)
MCHC: 33.4 g/dL (ref 32.0–36.0)
MCV: 93.1 fL (ref 80.0–100.0)
MPV: 10.5 fL (ref 7.5–12.5)
Monocytes Relative: 9 %
Neutro Abs: 2025 cells/uL (ref 1500–7800)
Neutrophils Relative %: 38.2 %
Platelets: 275 10*3/uL (ref 140–400)
RBC: 4.5 10*6/uL (ref 3.80–5.10)
RDW: 12.6 % (ref 11.0–15.0)
Total Lymphocyte: 50.5 %
WBC: 5.3 10*3/uL (ref 3.8–10.8)

## 2018-10-12 LAB — HEPATITIS C ANTIBODY
Hepatitis C Ab: NONREACTIVE
SIGNAL TO CUT-OFF: 0.16 (ref <1.00)

## 2018-10-23 ENCOUNTER — Encounter: Payer: Self-pay | Admitting: Internal Medicine

## 2018-11-19 ENCOUNTER — Other Ambulatory Visit: Payer: Self-pay

## 2018-11-19 ENCOUNTER — Ambulatory Visit (INDEPENDENT_AMBULATORY_CARE_PROVIDER_SITE_OTHER): Payer: Self-pay | Admitting: *Deleted

## 2018-11-19 DIAGNOSIS — Z1211 Encounter for screening for malignant neoplasm of colon: Secondary | ICD-10-CM

## 2018-11-19 MED ORDER — NA SULFATE-K SULFATE-MG SULF 17.5-3.13-1.6 GM/177ML PO SOLN: 1.0000 | Freq: Once | ORAL | 0 refills | Status: AC

## 2018-11-19 NOTE — Progress Notes (Addendum)
Gastroenterology Pre-Procedure Review  Request Date: 11/19/2018 Requesting Physician: Dr. Dennard Schaumann, no previous TCS  PATIENT REVIEW QUESTIONS: The patient responded to the following health history questions as indicated:    1. Diabetes Melitis: no 2. Joint replacements in the past 12 months: no 3. Major health problems in the past 3 months: no 4. Has an artificial valve or MVP: no 5. Has a defibrillator: no 6. Has been advised in past to take antibiotics in advance of a procedure like teeth cleaning: no 7. Family history of colon cancer: no 8. Alcohol Use: no 9. Illicit drug Use: no 10. History of sleep apnea: no 11. History of coronary artery or other vascular stents placed within the last 12 months: no 12. History of any prior anesthesia complications: no 13. There is no height or weight on file to calculate BMI. ht: 5'9 1/2 wt: 230 lbs    MEDICATIONS & ALLERGIES:    Patient reports the following regarding taking any blood thinners:   Plavix? no Aspirin? no Coumadin? no Brilinta? no Xarelto? no Eliquis? no Pradaxa? no Savaysa? no Effient? no  Patient confirms/reports the following medications:  Current Outpatient Medications  Medication Sig Dispense Refill  . aspirin-acetaminophen-caffeine (EXCEDRIN MIGRAINE) 250-250-65 MG tablet Take 2 tablets by mouth as needed for headache.     . Ibuprofen 200 MG CAPS Take by mouth as needed.    . Multiple Vitamins-Minerals (MULTIVITAMIN WOMEN 50+ PO) Take by mouth as needed.    . polyethylene glycol powder (GLYCOLAX/MIRALAX) powder Take 17 g by mouth daily. (Patient taking differently: Take 17 g by mouth as needed. ) 3350 g 1   No current facility-administered medications for this visit.     Patient confirms/reports the following allergies:  No Known Allergies  No orders of the defined types were placed in this encounter.   AUTHORIZATION INFORMATION Primary Insurance: [facility],  [place] #: [id number],  Group #: [id number] Pre-Cert / Josem Kaufmann  required: No, not required per Bernestine Amass / Auth #: Ref# [id number]  SCHEDULE INFORMATION: Procedure has been scheduled as follows:  Date: 02/22/2019, Time: 10:30 Location: [facility] with Dr. Oneida Alar  This Gastroenterology Pre-Precedure Review Form is being routed to the following provider(s): Neil Crouch, PA-C

## 2018-11-19 NOTE — Patient Instructions (Signed)
Crystal Clay  [birth year] MRN: [id number]     Procedure Date: 02/22/2019 Time to register: 9:30 am Place to register: [facility] Procedure Time: 10:30 am Scheduled provider: Dr. Oneida Alar    PREPARATION FOR COLONOSCOPY WITH SUPREP BOWEL PREP KIT  Note: Suprep Bowel Prep Kit is a split-dose (2day) regimen. Consumption of BOTH 6-ounce bottles is required for a complete prep.  Please notify us immediately if you are diabetic, take iron supplements, or if you are on Coumadin or any other blood thinners.                                                                                                                                                 1 DAY BEFORE PROCEDURE:  DATE: 02/21/2019  DAY: Sunday Continue clear liquids the entire day - NO SOLID FOOD.    At 6:00pm: Complete steps 1 through 4 below, using ONE (1) 6-ounce bottle, before going to bed. Step 1:  Pour ONE (1) 6-ounce bottle of SUPREP liquid into the mixing container.  Step 2:  Add cool drinking water to the 16 ounce line on the container and mix.  Note: Dilute the solution concentrate as directed prior to use. Step 3:  DRINK ALL the liquid in the container. Step 4:  You MUST drink an additional two (2) or more 16 ounce containers of water over the next one (1) hour.   Continue clear liquids.  DAY OF PROCEDURE:   DATE: 02/22/2019   DAY: Monday If you take medications for your heart, blood pressure, or breathing, you may take these medications.    5 hours before your procedure at 5:30: Step 1:  Pour ONE (1) 6-ounce bottle of SUPREP liquid into the mixing container.  Step 2:  Add cool drinking water to the 16 ounce line on the container and mix.  Note: Dilute the solution concentrate as directed prior to use. Step 3:  DRINK ALL the liquid in the container. Step 4:  You MUST drink an additional two (2) or more 16 ounce containers of water over the next one (1) hour. You MUST complete the final glass of water at  least 3 hours before your colonoscopy. Nothing by mouth past 7:30  You may take your morning medications with sip of water unless we have instructed otherwise.    Please see below for Dietary Information.  CLEAR LIQUIDS INCLUDE:  Water Jello (NOT red in color)   Ice Popsicles (NOT red in color)   Tea (sugar ok, no milk/cream) Powdered fruit flavored drinks  Coffee (sugar ok, no milk/cream) Gatorade/ Lemonade/ Kool-Aid  (NOT red in color)   Juice: apple, white grape, white cranberry Soft drinks  Clear bullion, consomme, broth (fat free beef/chicken/vegetable)  Carbonated beverages (any kind)  Strained chicken noodle soup Hard Candy   Remember: Clear liquids are liquids that will allow  you to see your fingers on the other side of a clear glass. Be sure liquids are NOT red in color, and not cloudy, but CLEAR.  DO NOT EAT OR DRINK ANY OF THE FOLLOWING:  Dairy products of any kind   Cranberry juice Tomato juice / V8 juice   Grapefruit juice Orange juice     Red grape juice  Do not eat any solid foods, including such foods as: cereal, oatmeal, yogurt, fruits, vegetables, creamed soups, eggs, bread, crackers, pureed foods in a blender, etc.   HELPFUL HINTS FOR DRINKING PREP SOLUTION:   Make sure prep is extremely cold. Mix and refrigerate the the morning of the prep. You may also put in the freezer.   You may try mixing some Crystal Light or Country Time Lemonade if you prefer. Mix in small amounts; add more if necessary.  Try drinking through a straw  Rinse mouth with water or a mouthwash between glasses, to remove after-taste.  Try sipping on a cold beverage /ice/ popsicles between glasses of prep.  Place a piece of sugar-free hard candy in mouth between glasses.  If you become nauseated, try consuming smaller amounts, or stretch out the time between glasses. Stop for 30-60 minutes, then slowly start back drinking.     OTHER INSTRUCTIONS  You will need a responsible adult at  least 53 years of age to accompany you and drive you home. This person must remain in the waiting room during your procedure. The hospital will cancel your procedure if you do not have a responsible adult with you.   1. Wear loose fitting clothing that is easily removed. 2. Leave jewelry and other valuables at home.  3. Remove all body piercing jewelry and leave at home. 4. Total time from sign-in until discharge is approximately 2-3 hours. 5. You should go home directly after your procedure and rest. You can resume normal activities the day after your procedure. 6. The day of your procedure you should not:  Drive  Make legal decisions  Operate machinery  Drink alcohol  Return to work   You may call the office (Dept: [phone]) before 5:00pm, or page the doctor on call [phone]) after 5:00pm, for further instructions, if necessary.   Insurance Information YOU WILL NEED TO CHECK WITH YOUR INSURANCE COMPANY FOR THE BENEFITS OF COVERAGE YOU HAVE FOR THIS PROCEDURE.  UNFORTUNATELY, NOT ALL INSURANCE COMPANIES HAVE BENEFITS TO COVER ALL OR PART OF THESE TYPES OF PROCEDURES.  IT IS YOUR RESPONSIBILITY TO CHECK YOUR BENEFITS, HOWEVER, WE WILL BE GLAD TO ASSIST YOU WITH ANY CODES YOUR INSURANCE COMPANY MAY NEED.    PLEASE NOTE THAT MOST INSURANCE COMPANIES WILL NOT COVER A SCREENING COLONOSCOPY FOR PEOPLE UNDER THE AGE OF 50  IF YOU HAVE [employer] INSURANCE, YOU MAY HAVE BENEFITS FOR A SCREENING COLONOSCOPY BUT IF POLYPS ARE FOUND THE DIAGNOSIS WILL CHANGE AND THEN YOU MAY HAVE A DEDUCTIBLE THAT WILL NEED TO BE MET. SO PLEASE MAKE SURE YOU CHECK YOUR BENEFITS FOR A SCREENING COLONOSCOPY AS WELL AS A DIAGNOSTIC COLONOSCOPY.

## 2018-11-20 NOTE — Progress Notes (Signed)
Ok to schedule.

## 2018-11-23 NOTE — Progress Notes (Signed)
Original authorization approved online [id number]).  Had to change provider so called [employer].  Spoke with Maudie Mercury and she said no authorization was required since the procedure was being done at a surgical center.  Ref# [id number].  Updated information in pt's triage form.

## 2018-11-23 NOTE — Addendum Note (Signed)
Addended by: Metro Kung on: 11/23/2018 07:49 AM   Modules accepted: Orders, SmartSet

## 2019-02-19 ENCOUNTER — Other Ambulatory Visit (HOSPITAL_COMMUNITY)
Admission: RE | Admit: 2019-02-19 | Discharge: 2019-02-19 | Disposition: A | Discharge status: Home / Self Care | Payer: 59 | Source: Ambulatory Visit | Attending: Gastroenterology | Admitting: Gastroenterology

## 2019-02-19 ENCOUNTER — Other Ambulatory Visit: Payer: Self-pay

## 2019-02-22 ENCOUNTER — Telehealth: Payer: Self-pay | Admitting: Gastroenterology

## 2019-02-22 ENCOUNTER — Encounter (HOSPITAL_COMMUNITY): Admission: RE | Payer: Self-pay | Source: Home / Self Care

## 2019-02-22 ENCOUNTER — Ambulatory Visit (HOSPITAL_COMMUNITY): Admission: RE | Admit: 2019-02-22 | Payer: 59 | Source: Home / Self Care | Admitting: Gastroenterology

## 2019-02-22 SURGERY — COLONOSCOPY
Anesthesia: Moderate Sedation

## 2019-02-22 NOTE — Telephone Encounter (Signed)
Melanie called from [facility] Stay to let us know patient was a no show for her covid test.

## 2019-02-22 NOTE — Telephone Encounter (Signed)
Spoke with pt and she said that she went for Covid test but she went to Short Stay first and by the time that she discovered the correct place, it was 3:28.  She said that she got turned away.  Pt is aware that her procedure needs to be rescheduled.  Informed pt that we are cancelling elective procedures due to current Covid surge and we will call her back to reschedule once we get clearance.  Pt voiced understanding.  Hoyle Sauer in Endo notified.

## 2019-03-22 ENCOUNTER — Telehealth: Payer: Self-pay | Admitting: *Deleted

## 2019-03-22 ENCOUNTER — Encounter: Payer: Self-pay | Admitting: *Deleted

## 2019-03-22 NOTE — Telephone Encounter (Signed)
Mailed letter to pt to contact our office to reschedule her procedure.

## 2019-03-22 NOTE — Telephone Encounter (Signed)
Lmom for pt to call us back.  Need to schedule procedure. 

## 2019-05-11 ENCOUNTER — Other Ambulatory Visit (HOSPITAL_COMMUNITY): Payer: Self-pay | Admitting: Family Medicine

## 2019-05-11 DIAGNOSIS — Z1231 Encounter for screening mammogram for malignant neoplasm of breast: Secondary | ICD-10-CM

## 2019-05-31 ENCOUNTER — Ambulatory Visit (HOSPITAL_COMMUNITY): Payer: 59

## 2019-06-14 ENCOUNTER — Ambulatory Visit (HOSPITAL_COMMUNITY)
Admission: RE | Admit: 2019-06-14 | Discharge: 2019-06-14 | Disposition: A | Discharge status: Home / Self Care | Payer: 59 | Source: Ambulatory Visit | Attending: Family Medicine | Admitting: Family Medicine

## 2019-06-14 ENCOUNTER — Ambulatory Visit (HOSPITAL_COMMUNITY): Payer: 59

## 2019-06-14 ENCOUNTER — Other Ambulatory Visit: Payer: Self-pay

## 2019-06-14 DIAGNOSIS — Z1231 Encounter for screening mammogram for malignant neoplasm of breast: Secondary | ICD-10-CM

## 2019-12-24 ENCOUNTER — Ambulatory Visit (INDEPENDENT_AMBULATORY_CARE_PROVIDER_SITE_OTHER): Payer: 59 | Admitting: Family Medicine

## 2019-12-24 ENCOUNTER — Other Ambulatory Visit: Payer: Self-pay

## 2019-12-24 VITALS — BP 130/80 | HR 95 | Temp 98.2°F | Wt 250.0 lb

## 2019-12-24 DIAGNOSIS — Z23 Encounter for immunization: Secondary | ICD-10-CM

## 2019-12-24 DIAGNOSIS — Z0000 Encounter for general adult medical examination without abnormal findings: Secondary | ICD-10-CM

## 2019-12-24 DIAGNOSIS — Z1211 Encounter for screening for malignant neoplasm of colon: Secondary | ICD-10-CM

## 2019-12-24 DIAGNOSIS — F17200 Nicotine dependence, unspecified, uncomplicated: Secondary | ICD-10-CM

## 2019-12-24 DIAGNOSIS — Z0001 Encounter for general adult medical examination with abnormal findings: Secondary | ICD-10-CM | POA: Diagnosis not present

## 2019-12-24 DIAGNOSIS — E049 Nontoxic goiter, unspecified: Secondary | ICD-10-CM

## 2019-12-24 NOTE — Progress Notes (Signed)
Subjective:    Patient ID: Crystal Clay, female    DOB: 1965-04-07, 54 y.o.   MRN: [id number]      Patient is a very pleasant 54 year old African-American female here today for complete physical exam.  She never went for her colonoscopy last year.  She also did not get the ultrasound of her neck to reevaluate her goiter.  She continues to smoke.  On exam today, the goiter seems larger particularly on the right side although I do not appreciate a discernible mass but rather diffuse enlargement.  She denies any dysphagia.  She is also wheezing slightly on exam which concerns me for the development of emphysema.  Patient is concerned because she is having a difficult time losing weight which makes me question if she may be becoming hypothyroid.  She is due for her flu shot.  She is already had her Covid vaccine.  She had her mammogram in May which was normal.  She had a Pap smear last year which was normal.  She is overdue for a colonoscopy which she missed last year.  Past Medical History:  Diagnosis Date  . Anemia   . Multinodular goiter   . Smoker    No past surgical history on file. Current Outpatient Medications on File Prior to Visit  Medication Sig Dispense Refill  . acetaminophen (TYLENOL) 500 MG tablet Take 500 mg by mouth every 6 (six) hours as needed for moderate pain.    Marland Kitchen ibuprofen (ADVIL) 200 MG tablet Take 200 mg by mouth every 6 (six) hours as needed for moderate pain.     . Menthol, Topical Analgesic, (BENGAY EX) Apply 1 application topically daily as needed (pain).    . polyethylene glycol powder (GLYCOLAX/MIRALAX) powder Take 17 g by mouth daily. (Patient not taking: Reported on 02/15/2019) 3350 g 1   No current facility-administered medications on file prior to visit.   No Known Allergies Social History   Socioeconomic History  . Marital status: Divorced    Spouse name: Not on file  . Number of children: Not on file  . Years of education: Not on file  . Highest  education level: Not on file  Occupational History  . Not on file  Tobacco Use  . Smoking status: Current Every Day Smoker  . Smokeless tobacco: Never Used  Substance and Sexual Activity  . Alcohol use: No  . Drug use: No  . Sexual activity: Not on file    Comment: divorced, 1 son, Web designer  Other Topics Concern  . Not on file  Social History Narrative  . Not on file   Social Determinants of Health   Financial Resource Strain:   . Difficulty of Paying Living Expenses: Not on file  Food Insecurity:   . Worried About Charity fundraiser in the Last Year: Not on file  . Ran Out of Food in the Last Year: Not on file  Transportation Needs:   . Lack of Transportation (Medical): Not on file  . Lack of Transportation (Non-Medical): Not on file  Physical Activity:   . Days of Exercise per Week: Not on file  . Minutes of Exercise per Session: Not on file  Stress:   . Feeling of Stress : Not on file  Social Connections:   . Frequency of Communication with Friends and Family: Not on file  . Frequency of Social Gatherings with Friends and Family: Not on file  . Attends Religious Services: Not on file  . Active  Member of Clubs or Organizations: Not on file  . Attends Archivist Meetings: Not on file  . Marital Status: Not on file  Intimate Partner Violence:   . Fear of Current or Ex-Partner: Not on file  . Emotionally Abused: Not on file  . Physically Abused: Not on file  . Sexually Abused: Not on file   Family History  Problem Relation Age of Onset  . Cancer Mother        breast  . COPD Mother   . Cancer Sister        breast  . Heart disease Maternal Grandfather      Review of Systems  All other systems reviewed and are negative.      Objective:   Physical Exam Vitals reviewed.  Constitutional:      General: She is not in acute distress.    Appearance: She is well-developed. She is not diaphoretic.  HENT:     Head: Normocephalic and  atraumatic.     Right Ear: Tympanic membrane, ear canal and external ear normal.     Left Ear: Tympanic membrane, ear canal and external ear normal.     Nose: Nose normal.     Mouth/Throat:     Pharynx: No oropharyngeal exudate.  Eyes:     General: No scleral icterus.       Right eye: No discharge.        Left eye: No discharge.     Conjunctiva/sclera: Conjunctivae normal.     Pupils: Pupils are equal, round, and reactive to light.  Neck:     Thyroid: Thyromegaly present.     Vascular: No carotid bruit or JVD.     Trachea: No tracheal deviation.  Cardiovascular:     Rate and Rhythm: Normal rate and regular rhythm.     Heart sounds: Normal heart sounds. No murmur heard.  No friction rub. No gallop.   Pulmonary:     Effort: Pulmonary effort is normal. No respiratory distress.     Breath sounds: No stridor. Wheezing present. No rales.  Chest:     Chest wall: No tenderness.  Abdominal:     General: Bowel sounds are normal. There is no distension.     Palpations: Abdomen is soft. There is no mass.     Tenderness: There is no abdominal tenderness. There is no guarding or rebound.  Musculoskeletal:        General: No tenderness. Normal range of motion.     Cervical back: Normal range of motion and neck supple. No rigidity.  Lymphadenopathy:     Cervical: No cervical adenopathy.  Skin:    General: Skin is warm.     Coloration: Skin is not pale.     Findings: No erythema or rash.  Neurological:     General: No focal deficit present.     Mental Status: She is alert and oriented to person, place, and time. Mental status is at baseline.     Cranial Nerves: No cranial nerve deficit.     Sensory: No sensory deficit.     Motor: No weakness or abnormal muscle tone.     Coordination: Coordination normal.     Gait: Gait normal.     Deep Tendon Reflexes: Reflexes are normal and symmetric. Reflexes normal.  Psychiatric:        Behavior: Behavior normal.        Thought Content: Thought  content normal.        Judgment: Judgment normal.  Assessment & Plan:   General medical exam - Plan: CBC with Differential/Platelet, COMPLETE METABOLIC PANEL WITH GFR, Lipid panel  Goiter - Plan: US THYROID, TSH  Smoker  Colon cancer screening  Patient received her flu shot today.  Her Covid vaccine is up-to-date.  Mammogram is up-to-date.  Pap smear is up-to-date.  We discussed her colonoscopy.  We also discussed Cologuard.  Patient is willing to consent to Cologuard and declines a colonoscopy at the present time.  She is not yet due for a bone density.  Recommended strongly smoking cessation as I am concerned that she is developing signs of emphysema.  Next year we consider screening the patient for lung cancer given age and tobacco use for more than 30 pack years.  Also her goiter seems to be larger.  Check TSH along with thyroid ultrasound to reevaluate.

## 2019-12-25 LAB — COMPLETE METABOLIC PANEL WITH GFR
AG Ratio: 1.4 (calc) (ref 1.0–2.5)
ALT: 12 U/L (ref 6–29)
AST: 14 U/L (ref 10–35)
Albumin: 4 g/dL (ref 3.6–5.1)
Alkaline phosphatase (APISO): 139 U/L (ref 37–153)
BUN: 8 mg/dL (ref 7–25)
CO2: 25 mmol/L (ref 20–32)
Calcium: 9 mg/dL (ref 8.6–10.4)
Chloride: 102 mmol/L (ref 98–110)
Creat: 0.63 mg/dL (ref 0.50–1.05)
GFR, Est African American: 118 mL/min/{1.73_m2} (ref >=60)
GFR, Est Non African American: 102 mL/min/{1.73_m2} (ref >=60)
Globulin: 2.9 g/dL (calc) (ref 1.9–3.7)
Glucose, Bld: 85 mg/dL (ref 65–99)
Potassium: 4.1 mmol/L (ref 3.5–5.3)
Sodium: 141 mmol/L (ref 135–146)
Total Bilirubin: 0.5 mg/dL (ref 0.2–1.2)
Total Protein: 6.9 g/dL (ref 6.1–8.1)

## 2019-12-25 LAB — CBC WITH DIFFERENTIAL/PLATELET
Absolute Monocytes: 367 cells/uL (ref 200–950)
Basophils Absolute: 31 cells/uL (ref 0–200)
Basophils Relative: 0.6 %
Eosinophils Absolute: 82 cells/uL (ref 15–500)
Eosinophils Relative: 1.6 %
HCT: 42.3 % (ref 35.0–45.0)
Hemoglobin: 14.1 g/dL (ref 11.7–15.5)
Lymphs Abs: 2295 cells/uL (ref 850–3900)
MCH: 30.7 pg (ref 27.0–33.0)
MCHC: 33.3 g/dL (ref 32.0–36.0)
MCV: 92.2 fL (ref 80.0–100.0)
MPV: 10.3 fL (ref 7.5–12.5)
Monocytes Relative: 7.2 %
Neutro Abs: 2326 cells/uL (ref 1500–7800)
Neutrophils Relative %: 45.6 %
Platelets: 269 10*3/uL (ref 140–400)
RBC: 4.59 10*6/uL (ref 3.80–5.10)
RDW: 12.7 % (ref 11.0–15.0)
Total Lymphocyte: 45 %
WBC: 5.1 10*3/uL (ref 3.8–10.8)

## 2019-12-25 LAB — LIPID PANEL
Cholesterol: 171 mg/dL (ref <200)
HDL: 40 mg/dL — ABNORMAL LOW (ref >=50)
LDL Cholesterol (Calc): 114 mg/dL (calc) — ABNORMAL HIGH
Non-HDL Cholesterol (Calc): 131 mg/dL (calc) — ABNORMAL HIGH (ref <130)
Total CHOL/HDL Ratio: 4.3 (calc) (ref <5.0)
Triglycerides: 75 mg/dL (ref <150)

## 2019-12-25 LAB — TSH: TSH: 1.3 mIU/L

## 2019-12-27 ENCOUNTER — Telehealth: Payer: Self-pay

## 2019-12-27 NOTE — Telephone Encounter (Signed)
Printed out letter for Pt flu shot, Pt verbalized understanding of lab results given

## 2020-01-10 LAB — COLOGUARD: Cologuard: NEGATIVE

## 2020-01-16 LAB — COLOGUARD: COLOGUARD: NEGATIVE

## 2020-01-18 ENCOUNTER — Ambulatory Visit
Admission: RE | Admit: 2020-01-18 | Discharge: 2020-01-18 | Disposition: A | Discharge status: Home / Self Care | Payer: 59 | Source: Ambulatory Visit | Attending: Family Medicine | Admitting: Family Medicine

## 2020-01-18 ENCOUNTER — Encounter: Payer: Self-pay | Admitting: *Deleted

## 2020-01-18 DIAGNOSIS — E049 Nontoxic goiter, unspecified: Secondary | ICD-10-CM

## 2020-01-21 ENCOUNTER — Other Ambulatory Visit: Payer: Self-pay

## 2020-01-21 DIAGNOSIS — E049 Nontoxic goiter, unspecified: Secondary | ICD-10-CM

## 2020-01-24 ENCOUNTER — Telehealth: Payer: Self-pay

## 2020-01-24 NOTE — Telephone Encounter (Signed)
Spoke with Ms. Crystal Clay she is aware of results, and order has been placed for Biopsy.

## 2020-01-27 ENCOUNTER — Ambulatory Visit
Admission: RE | Admit: 2020-01-27 | Discharge: 2020-01-27 | Disposition: A | Discharge status: Home / Self Care | Payer: 59 | Source: Ambulatory Visit | Attending: Family Medicine | Admitting: Family Medicine

## 2020-01-27 ENCOUNTER — Other Ambulatory Visit (HOSPITAL_COMMUNITY)
Admission: RE | Admit: 2020-01-27 | Discharge: 2020-01-27 | Disposition: A | Discharge status: Home / Self Care | Payer: 59 | Source: Ambulatory Visit | Attending: Student | Admitting: Student

## 2020-01-27 DIAGNOSIS — D440 Neoplasm of uncertain behavior of thyroid gland: Secondary | ICD-10-CM | POA: Diagnosis present

## 2020-01-27 DIAGNOSIS — E049 Nontoxic goiter, unspecified: Secondary | ICD-10-CM

## 2020-01-28 LAB — CYTOLOGY - NON PAP

## 2020-02-10 ENCOUNTER — Telehealth: Payer: Self-pay | Admitting: *Deleted

## 2020-02-10 NOTE — Telephone Encounter (Signed)
Received call from patient.   Requested results of tyroid biopsy.   Noted pathology in chart.   Please advise.

## 2020-02-14 NOTE — Telephone Encounter (Signed)
Call placed to patient. LMTRC.  

## 2020-02-14 NOTE — Telephone Encounter (Signed)
I am sorry, not sure what happened.  However, tell her the biopsy returned great as a benign nodule.  No cancer.

## 2020-02-15 NOTE — Telephone Encounter (Signed)
Call placed to patient. LMTRC.  

## 2020-02-16 NOTE — Telephone Encounter (Signed)
Call placed to patient and patient made aware.  

## 2020-02-16 NOTE — Telephone Encounter (Signed)
Patient left vm requesting results

## 2020-05-11 ENCOUNTER — Telehealth: Payer: Self-pay | Admitting: *Deleted

## 2020-05-11 NOTE — Telephone Encounter (Signed)
Received call from patient.   Reports that mother has recently been diagnosed with cancer and she would like to submit FMLA to help mother with care.   Advised to contact her HR department and have forms sent to office.

## 2020-12-07 ENCOUNTER — Other Ambulatory Visit: Payer: Self-pay

## 2020-12-07 ENCOUNTER — Ambulatory Visit (INDEPENDENT_AMBULATORY_CARE_PROVIDER_SITE_OTHER): Payer: 59 | Admitting: *Deleted

## 2020-12-07 DIAGNOSIS — Z23 Encounter for immunization: Secondary | ICD-10-CM | POA: Diagnosis not present

## 2021-04-09 ENCOUNTER — Other Ambulatory Visit (HOSPITAL_COMMUNITY): Payer: Self-pay | Admitting: Family Medicine

## 2021-04-09 DIAGNOSIS — Z1231 Encounter for screening mammogram for malignant neoplasm of breast: Secondary | ICD-10-CM

## 2021-04-23 ENCOUNTER — Other Ambulatory Visit: Payer: Self-pay

## 2021-04-23 ENCOUNTER — Ambulatory Visit (HOSPITAL_COMMUNITY)
Admission: RE | Admit: 2021-04-23 | Discharge: 2021-04-23 | Disposition: A | Discharge status: Home / Self Care | Payer: 59 | Source: Ambulatory Visit | Attending: Family Medicine | Admitting: Family Medicine

## 2021-04-23 DIAGNOSIS — Z1231 Encounter for screening mammogram for malignant neoplasm of breast: Secondary | ICD-10-CM | POA: Insufficient documentation

## 2021-09-14 ENCOUNTER — Other Ambulatory Visit: Payer: 59

## 2021-09-14 DIAGNOSIS — Z0000 Encounter for general adult medical examination without abnormal findings: Secondary | ICD-10-CM

## 2021-09-14 DIAGNOSIS — Z1211 Encounter for screening for malignant neoplasm of colon: Secondary | ICD-10-CM

## 2021-09-15 LAB — COMPREHENSIVE METABOLIC PANEL
AG Ratio: 1.3 (calc) (ref 1.0–2.5)
ALT: 11 U/L (ref 6–29)
AST: 13 U/L (ref 10–35)
Albumin: 4.1 g/dL (ref 3.6–5.1)
Alkaline phosphatase (APISO): 137 U/L (ref 37–153)
BUN: 10 mg/dL (ref 7–25)
CO2: 29 mmol/L (ref 20–32)
Calcium: 9.1 mg/dL (ref 8.6–10.4)
Chloride: 105 mmol/L (ref 98–110)
Creat: 0.68 mg/dL (ref 0.50–1.03)
Globulin: 3.2 g/dL (calc) (ref 1.9–3.7)
Glucose, Bld: 87 mg/dL (ref 65–99)
Potassium: 4.9 mmol/L (ref 3.5–5.3)
Sodium: 140 mmol/L (ref 135–146)
Total Bilirubin: 0.4 mg/dL (ref 0.2–1.2)
Total Protein: 7.3 g/dL (ref 6.1–8.1)

## 2021-09-15 LAB — CBC WITH DIFFERENTIAL/PLATELET
Absolute Monocytes: 523 cells/uL (ref 200–950)
Basophils Absolute: 34 cells/uL (ref 0–200)
Basophils Relative: 0.5 %
Eosinophils Absolute: 114 cells/uL (ref 15–500)
Eosinophils Relative: 1.7 %
HCT: 42.9 % (ref 35.0–45.0)
Hemoglobin: 14.4 g/dL (ref 11.7–15.5)
Lymphs Abs: 2693 cells/uL (ref 850–3900)
MCH: 31 pg (ref 27.0–33.0)
MCHC: 33.6 g/dL (ref 32.0–36.0)
MCV: 92.3 fL (ref 80.0–100.0)
MPV: 10.3 fL (ref 7.5–12.5)
Monocytes Relative: 7.8 %
Neutro Abs: 3337 cells/uL (ref 1500–7800)
Neutrophils Relative %: 49.8 %
Platelets: 278 10*3/uL (ref 140–400)
RBC: 4.65 10*6/uL (ref 3.80–5.10)
RDW: 12.9 % (ref 11.0–15.0)
Total Lymphocyte: 40.2 %
WBC: 6.7 10*3/uL (ref 3.8–10.8)

## 2021-09-15 LAB — LIPID PANEL
Cholesterol: 170 mg/dL (ref <200)
HDL: 47 mg/dL — ABNORMAL LOW (ref >=50)
LDL Cholesterol (Calc): 108 mg/dL (calc) — ABNORMAL HIGH
Non-HDL Cholesterol (Calc): 123 mg/dL (calc) (ref <130)
Total CHOL/HDL Ratio: 3.6 (calc) (ref <5.0)
Triglycerides: 66 mg/dL (ref <150)

## 2021-09-28 ENCOUNTER — Ambulatory Visit (INDEPENDENT_AMBULATORY_CARE_PROVIDER_SITE_OTHER): Payer: 59 | Admitting: Family Medicine

## 2021-09-28 VITALS — BP 128/42 | HR 92 | Ht 69.5 in | Wt 262.2 lb

## 2021-09-28 DIAGNOSIS — Z0000 Encounter for general adult medical examination without abnormal findings: Secondary | ICD-10-CM

## 2021-09-28 DIAGNOSIS — F17200 Nicotine dependence, unspecified, uncomplicated: Secondary | ICD-10-CM

## 2021-09-28 DIAGNOSIS — Z122 Encounter for screening for malignant neoplasm of respiratory organs: Secondary | ICD-10-CM

## 2021-09-28 NOTE — Progress Notes (Signed)
Subjective:    Patient ID: Crystal Clay, female    DOB: 1965-07-09, 56 y.o.   MRN: [id number]     Patient is here today for complete physical exam.  Unfortunately she continues to smoke.  She denies any chest pain but she does have occasional dyspnea with strenuous activity.  She is trying to quit smoking.  She denies any cough or hemoptysis or wheezing.  She is due for a flu shot as well as a COVID booster.  She is also due for the shingles vaccine.  She had Cologuard in 2021.  He is due again next year.  Her last Pap smear was 2020.  She is due this year but she would like to defer that to a later date this year as she was not prepared for this today.  She is not yet due for a bone density test.  Her mammogram was performed in March and is up-to-date. Lab on 09/14/2021  Component Date Value Ref Range Status   WBC 09/14/2021 6.7  3.8 - 10.8 Thousand/uL Final   RBC 09/14/2021 4.65  3.80 - 5.10 Million/uL Final   Hemoglobin 09/14/2021 14.4  11.7 - 15.5 g/dL Final   HCT 09/14/2021 42.9  35.0 - 45.0 % Final   MCV 09/14/2021 92.3  80.0 - 100.0 fL Final   MCH 09/14/2021 31.0  27.0 - 33.0 pg Final   MCHC 09/14/2021 33.6  32.0 - 36.0 g/dL Final   RDW 09/14/2021 12.9  11.0 - 15.0 % Final   Platelets 09/14/2021 278  140 - 400 Thousand/uL Final   MPV 09/14/2021 10.3  7.5 - 12.5 fL Final   Neutro Abs 09/14/2021 3,337  1,500 - 7,800 cells/uL Final   Lymphs Abs 09/14/2021 2,693  850 - 3,900 cells/uL Final   Absolute Monocytes 09/14/2021 523  200 - 950 cells/uL Final   Eosinophils Absolute 09/14/2021 114  15 - 500 cells/uL Final   Basophils Absolute 09/14/2021 34  0 - 200 cells/uL Final   Neutrophils Relative % 09/14/2021 49.8  % Final   Total Lymphocyte 09/14/2021 40.2  % Final   Monocytes Relative 09/14/2021 7.8  % Final   Eosinophils Relative 09/14/2021 1.7  % Final   Basophils Relative 09/14/2021 0.5  % Final   Glucose, Bld 09/14/2021 87  65 - 99 mg/dL Final   Comment: .            Fasting  reference interval .    BUN 09/14/2021 10  7 - 25 mg/dL Final   Creat 09/14/2021 0.68  0.50 - 1.03 mg/dL Final   BUN/Creatinine Ratio 09/14/2021 SEE NOTE:  6 - 22 (calc) Final   Comment:    Not Reported: BUN and Creatinine are within    reference range. .    Sodium 09/14/2021 140  135 - 146 mmol/L Final   Potassium 09/14/2021 4.9  3.5 - 5.3 mmol/L Final   Chloride 09/14/2021 105  98 - 110 mmol/L Final   CO2 09/14/2021 29  20 - 32 mmol/L Final   Calcium 09/14/2021 9.1  8.6 - 10.4 mg/dL Final   Total Protein 09/14/2021 7.3  6.1 - 8.1 g/dL Final   Albumin 09/14/2021 4.1  3.6 - 5.1 g/dL Final   Globulin 09/14/2021 3.2  1.9 - 3.7 g/dL (calc) Final   AG Ratio 09/14/2021 1.3  1.0 - 2.5 (calc) Final   Total Bilirubin 09/14/2021 0.4  0.2 - 1.2 mg/dL Final   Alkaline phosphatase (APISO) 09/14/2021 137  37 - 153  U/L Final   AST 09/14/2021 13  10 - 35 U/L Final   ALT 09/14/2021 11  6 - 29 U/L Final   Cholesterol 09/14/2021 170  <200 mg/dL Final   HDL 09/14/2021 47 (L)  > OR = 50 mg/dL Final   Triglycerides 09/14/2021 66  <150 mg/dL Final   LDL Cholesterol (Calc) 09/14/2021 108 (H)  mg/dL (calc) Final   Comment: Reference range: <100 . Desirable range <100 mg/dL for primary prevention;   <70 mg/dL for patients with CHD or diabetic patients  with > or = 2 CHD risk factors. Marland Kitchen LDL-C is now calculated using the Martin-Hopkins  calculation, which is a validated novel method providing  better accuracy than the Friedewald equation in the  estimation of LDL-C.  Cresenciano Genre et al. Annamaria Helling. 8099;833(82): 2061-2068  (http://education.[facility].com/faq/FAQ164)    Total CHOL/HDL Ratio 09/14/2021 3.6  <5.0 (calc) Final   Non-HDL Cholesterol (Calc) 09/14/2021 123  <130 mg/dL (calc) Final   Comment: For patients with diabetes plus 1 major ASCVD risk  factor, treating to a non-HDL-C goal of <100 mg/dL  (LDL-C of <70 mg/dL) is considered a therapeutic  option.     Past Medical History:   Diagnosis Date   Anemia    Multinodular goiter    Smoker    No past surgical history on file. Current Outpatient Medications on File Prior to Visit  Medication Sig Dispense Refill   acetaminophen (TYLENOL) 500 MG tablet Take 500 mg by mouth every 6 (six) hours as needed for moderate pain.     ibuprofen (ADVIL) 200 MG tablet Take 200 mg by mouth every 6 (six) hours as needed for moderate pain.      Menthol, Topical Analgesic, (BENGAY EX) Apply 1 application topically daily as needed (pain).     No current facility-administered medications on file prior to visit.   Allergies  Allergen Reactions   5-Alpha Reductase Inhibitors    Social History   Socioeconomic History   Marital status: Divorced    Spouse name: Not on file   Number of children: Not on file   Years of education: Not on file   Highest education level: Not on file  Occupational History   Not on file  Tobacco Use   Smoking status: Every Day   Smokeless tobacco: Never  Substance and Sexual Activity   Alcohol use: No   Drug use: No   Sexual activity: Not on file    Comment: divorced, 1 son, Web designer  Other Topics Concern   Not on file  Social History Narrative   Not on file   Social Determinants of Health   Financial Resource Strain: Not on file  Food Insecurity: Not on file  Transportation Needs: Not on file  Physical Activity: Not on file  Stress: Not on file  Social Connections: Not on file  Intimate Partner Violence: Not on file   Family History  Problem Relation Age of Onset   Cancer Mother        breast   COPD Mother    Cancer Sister        breast   Heart disease Maternal Grandfather      Review of Systems  All other systems reviewed and are negative.      Objective:   Physical Exam Vitals reviewed.  Constitutional:      General: She is not in acute distress.    Appearance: She is well-developed. She is not diaphoretic.  HENT:  Head: Normocephalic and  atraumatic.     Right Ear: Tympanic membrane, ear canal and external ear normal.     Left Ear: Tympanic membrane, ear canal and external ear normal.     Nose: Nose normal.     Mouth/Throat:     Pharynx: No oropharyngeal exudate.  Eyes:     General: No scleral icterus.       Right eye: No discharge.        Left eye: No discharge.     Conjunctiva/sclera: Conjunctivae normal.     Pupils: Pupils are equal, round, and reactive to light.  Neck:     Thyroid: Thyromegaly present.     Vascular: No carotid bruit or JVD.     Trachea: No tracheal deviation.  Cardiovascular:     Rate and Rhythm: Normal rate and regular rhythm.     Heart sounds: Normal heart sounds. No murmur heard.    No friction rub. No gallop.  Pulmonary:     Effort: Pulmonary effort is normal. No respiratory distress.     Breath sounds: No stridor. Wheezing present. No rales.  Chest:     Chest wall: No tenderness.  Abdominal:     General: Bowel sounds are normal. There is no distension.     Palpations: Abdomen is soft. There is no mass.     Tenderness: There is no abdominal tenderness. There is no guarding or rebound.  Musculoskeletal:        General: No tenderness. Normal range of motion.     Cervical back: Normal range of motion and neck supple. No rigidity.  Lymphadenopathy:     Cervical: No cervical adenopathy.  Skin:    General: Skin is warm.     Coloration: Skin is not pale.     Findings: No erythema or rash.  Neurological:     General: No focal deficit present.     Mental Status: She is alert and oriented to person, place, and time. Mental status is at baseline.     Cranial Nerves: No cranial nerve deficit.     Sensory: No sensory deficit.     Motor: No weakness or abnormal muscle tone.     Coordination: Coordination normal.     Gait: Gait normal.     Deep Tendon Reflexes: Reflexes are normal and symmetric. Reflexes normal.  Psychiatric:        Behavior: Behavior normal.        Thought Content: Thought  content normal.        Judgment: Judgment normal.           Assessment & Plan:   Screening for lung cancer - Plan: CT CHEST LUNG CA SCREEN LOW DOSE W/O CM  Smoker - Plan: CT CHEST LUNG CA SCREEN LOW DOSE W/O CM  General medical exam First and foremost I recommended smoking cessation.  Second I recommended screening for lung cancer.  The patient agrees to allow me to schedule a CT scan of the chest.  Colon cancer screening is up-to-date.  Mammogram is up-to-date.  She is due for a Pap smear today.  She defers that to a later appointment.  Recommended a flu shot, shingles vaccine, and COVID booster.

## 2021-10-23 ENCOUNTER — Ambulatory Visit (HOSPITAL_COMMUNITY)
Admission: RE | Admit: 2021-10-23 | Discharge: 2021-10-23 | Disposition: A | Discharge status: Home / Self Care | Payer: 59 | Source: Ambulatory Visit | Attending: Family Medicine | Admitting: Family Medicine

## 2021-10-23 DIAGNOSIS — F17200 Nicotine dependence, unspecified, uncomplicated: Secondary | ICD-10-CM | POA: Insufficient documentation

## 2021-10-23 DIAGNOSIS — Z122 Encounter for screening for malignant neoplasm of respiratory organs: Secondary | ICD-10-CM | POA: Diagnosis present

## 2021-11-22 ENCOUNTER — Telehealth: Payer: Self-pay | Admitting: Family Medicine

## 2021-11-22 NOTE — Telephone Encounter (Signed)
Returned patient's call to schedule appt for pap. Left message to return call.

## 2021-12-07 ENCOUNTER — Ambulatory Visit: Payer: 59 | Admitting: Family Medicine

## 2021-12-07 VITALS — BP 132/86 | HR 98 | Ht 69.5 in | Wt 258.0 lb

## 2021-12-07 DIAGNOSIS — Z124 Encounter for screening for malignant neoplasm of cervix: Secondary | ICD-10-CM | POA: Diagnosis not present

## 2021-12-07 DIAGNOSIS — Z23 Encounter for immunization: Secondary | ICD-10-CM

## 2021-12-07 NOTE — Progress Notes (Signed)
Subjective:    Patient ID: Crystal Clay, female    DOB: 1965/10/29, 56 y.o.   MRN: [id number]  Patient is here today for Pap smear.  She denies any vaginal bleeding.  She denies any pelvic pain.  She is already had a CT scan to screen for lung cancer which was negative although it did show some emphysema.  She is also due for her flu shot.  Past Medical History:  Diagnosis Date   Anemia    Multinodular goiter    Smoker    No past surgical history on file. Current Outpatient Medications on File Prior to Visit  Medication Sig Dispense Refill   acetaminophen (TYLENOL) 500 MG tablet Take 500 mg by mouth every 6 (six) hours as needed for moderate pain.     ibuprofen (ADVIL) 200 MG tablet Take 200 mg by mouth every 6 (six) hours as needed for moderate pain.      Menthol, Topical Analgesic, (BENGAY EX) Apply 1 application topically daily as needed (pain).     No current facility-administered medications on file prior to visit.   Allergies  Allergen Reactions   5-Alpha Reductase Inhibitors    Social History   Socioeconomic History   Marital status: Divorced    Spouse name: Not on file   Number of children: Not on file   Years of education: Not on file   Highest education level: Not on file  Occupational History   Not on file  Tobacco Use   Smoking status: Every Day   Smokeless tobacco: Never  Substance and Sexual Activity   Alcohol use: No   Drug use: No   Sexual activity: Not on file    Comment: divorced, 1 son, Web designer  Other Topics Concern   Not on file  Social History Narrative   Not on file   Social Determinants of Health   Financial Resource Strain: Not on file  Food Insecurity: Not on file  Transportation Needs: Not on file  Physical Activity: Not on file  Stress: Not on file  Social Connections: Not on file  Intimate Partner Violence: Not on file   Family History  Problem Relation Age of Onset   Cancer Mother        breast   COPD  Mother    Cancer Sister        breast   Heart disease Maternal Grandfather      Review of Systems  All other systems reviewed and are negative.      Objective:   Physical Exam Vitals reviewed. Exam conducted with a chaperone present.  Constitutional:      General: She is not in acute distress.    Appearance: She is well-developed. She is not diaphoretic.  HENT:     Head: Normocephalic and atraumatic.     Right Ear: Tympanic membrane, ear canal and external ear normal.     Left Ear: Tympanic membrane, ear canal and external ear normal.     Nose: Nose normal.     Mouth/Throat:     Pharynx: No oropharyngeal exudate.  Eyes:     General: No scleral icterus.       Right eye: No discharge.        Left eye: No discharge.     Conjunctiva/sclera: Conjunctivae normal.     Pupils: Pupils are equal, round, and reactive to light.  Neck:     Thyroid: Thyromegaly present.     Vascular: No carotid bruit or JVD.  Trachea: No tracheal deviation.  Cardiovascular:     Rate and Rhythm: Normal rate and regular rhythm.     Heart sounds: Normal heart sounds. No murmur heard.    No friction rub. No gallop.  Pulmonary:     Effort: Pulmonary effort is normal. No respiratory distress.     Breath sounds: No stridor. Wheezing present. No rales.  Chest:     Chest wall: No tenderness.  Abdominal:     General: Bowel sounds are normal. There is no distension.     Palpations: Abdomen is soft. There is no mass.     Tenderness: There is no abdominal tenderness. There is no guarding or rebound.  Genitourinary:    General: Normal vulva.     Vagina: Normal.     Cervix: Normal.     Uterus: Normal.      Adnexa: Right adnexa normal and left adnexa normal.  Musculoskeletal:        General: No tenderness. Normal range of motion.     Cervical back: Normal range of motion and neck supple. No rigidity.  Lymphadenopathy:     Cervical: No cervical adenopathy.  Skin:    General: Skin is warm.      Coloration: Skin is not pale.     Findings: No erythema or rash.  Neurological:     General: No focal deficit present.     Mental Status: She is alert and oriented to person, place, and time. Mental status is at baseline.     Cranial Nerves: No cranial nerve deficit.     Sensory: No sensory deficit.     Motor: No weakness or abnormal muscle tone.     Coordination: Coordination normal.     Gait: Gait normal.     Deep Tendon Reflexes: Reflexes are normal and symmetric. Reflexes normal.  Psychiatric:        Behavior: Behavior normal.        Thought Content: Thought content normal.        Judgment: Judgment normal.           Assessment & Plan:   Cervical cancer screening Pap smear sent to pathology and labeled container.  Adnexal exam was limited due to body habitus.  Patient received a flu shot.  Recommended COVID-vaccine.

## 2021-12-07 NOTE — Addendum Note (Signed)
Addended by: Randal Buba K on: 12/07/2021 02:29 PM   Modules accepted: Orders

## 2021-12-12 LAB — PAP, TP IMAGING W/ HPV RNA, RFLX HPV TYPE 16,18/45: HPV DNA High Risk: NOT DETECTED

## 2022-06-04 ENCOUNTER — Other Ambulatory Visit (HOSPITAL_COMMUNITY): Payer: Self-pay | Admitting: Family Medicine

## 2022-06-04 DIAGNOSIS — Z1231 Encounter for screening mammogram for malignant neoplasm of breast: Secondary | ICD-10-CM

## 2022-06-21 ENCOUNTER — Encounter (HOSPITAL_COMMUNITY): Payer: Self-pay

## 2022-06-21 ENCOUNTER — Ambulatory Visit (HOSPITAL_COMMUNITY)
Admission: RE | Admit: 2022-06-21 | Discharge: 2022-06-21 | Disposition: A | Discharge status: Home / Self Care | Payer: 59 | Source: Ambulatory Visit | Attending: Family Medicine | Admitting: Family Medicine

## 2022-06-21 DIAGNOSIS — Z1231 Encounter for screening mammogram for malignant neoplasm of breast: Secondary | ICD-10-CM

## 2022-12-20 ENCOUNTER — Ambulatory Visit (INDEPENDENT_AMBULATORY_CARE_PROVIDER_SITE_OTHER): Payer: 59

## 2022-12-20 DIAGNOSIS — Z23 Encounter for immunization: Secondary | ICD-10-CM | POA: Diagnosis not present

## 2023-01-31 ENCOUNTER — Encounter: Payer: Self-pay | Admitting: Family Medicine

## 2023-01-31 ENCOUNTER — Ambulatory Visit: Payer: 59 | Admitting: Family Medicine

## 2023-01-31 VITALS — BP 134/84 | HR 90 | Temp 98.2°F | Ht 69.5 in | Wt 260.1 lb

## 2023-01-31 DIAGNOSIS — L720 Epidermal cyst: Secondary | ICD-10-CM | POA: Diagnosis not present

## 2023-01-31 NOTE — Progress Notes (Signed)
Subjective:    Patient ID: Crystal Clay, female    DOB: Jun 30, 1965, 57 y.o.   MRN: [id number]   Patient has had a knot/mass on her occiput for 8 months.  At times it can drain and be very tender.  At other times it is pain-free.  On examination today, the patient has a 2 cm sebaceous/epidermoid cyst on her occiput.  There is no erythema warmth or pain Past Medical History:  Diagnosis Date   Anemia    Multinodular goiter    Smoker    History reviewed. No pertinent surgical history. Current Outpatient Medications on File Prior to Visit  Medication Sig Dispense Refill   acetaminophen (TYLENOL) 500 MG tablet Take 500 mg by mouth every 6 (six) hours as needed for moderate pain.     ibuprofen (ADVIL) 200 MG tablet Take 200 mg by mouth every 6 (six) hours as needed for moderate pain.      Menthol, Topical Analgesic, (BENGAY EX) Apply 1 application topically daily as needed (pain).     No current facility-administered medications on file prior to visit.   Allergies  Allergen Reactions   5-Alpha Reductase Inhibitors    Social History   Socioeconomic History   Marital status: Divorced    Spouse name: Not on file   Number of children: Not on file   Years of education: Not on file   Highest education level: Associate degree: occupational, Scientist, product/process development, or vocational program  Occupational History   Not on file  Tobacco Use   Smoking status: Every Day   Smokeless tobacco: Never  Substance and Sexual Activity   Alcohol use: No   Drug use: No   Sexual activity: Not on file    Comment: divorced, 1 son, Environmental health practitioner  Other Topics Concern   Not on file  Social History Narrative   Not on file   Social Drivers of Health   Financial Resource Strain: Medium Risk (01/27/2023)   Overall Financial Resource Strain (CARDIA)    Difficulty of Paying Living Expenses: Somewhat hard  Food Insecurity: Food Insecurity Present (01/27/2023)   Hunger Vital Sign    Worried About Running  Out of Food in the Last Year: Never true    Ran Out of Food in the Last Year: Sometimes true  Transportation Needs: No Transportation Needs (01/27/2023)   PRAPARE - Administrator, Civil Service (Medical): No    Lack of Transportation (Non-Medical): No  Physical Activity: Insufficiently Active (01/27/2023)   Exercise Vital Sign    Days of Exercise per Week: 5 days    Minutes of Exercise per Session: 10 min  Stress: No Stress Concern Present (01/27/2023)   [employer] of Occupational Health - Occupational Stress Questionnaire    Feeling of Stress : Only a little  Social Connections: Moderately Isolated (01/27/2023)   Social Connection and Isolation Panel [NHANES]    Frequency of Communication with Friends and Family: More than three times a week    Frequency of Social Gatherings with Friends and Family: More than three times a week    Attends Religious Services: 1 to 4 times per year    Active Member of [employer] or Organizations: No    Attends Engineer, structural: Not on file    Marital Status: Divorced  Intimate Partner Violence: Not on file   Family History  Problem Relation Age of Onset   Cancer Mother        breast   COPD Mother  Cancer Sister        breast   Heart disease Maternal Grandfather      Review of Systems  All other systems reviewed and are negative.      Objective:   Physical Exam Vitals reviewed.  Constitutional:      General: She is not in acute distress.    Appearance: She is well-developed. She is not diaphoretic.  HENT:     Head: Normocephalic and atraumatic.   Neck:     Thyroid: Thyromegaly present.     Vascular: No JVD.     Trachea: No tracheal deviation.  Cardiovascular:     Rate and Rhythm: Normal rate and regular rhythm.     Heart sounds: Normal heart sounds. No murmur heard.    No friction rub. No gallop.  Pulmonary:     Effort: Pulmonary effort is normal. No respiratory distress.     Breath sounds: No  stridor. No wheezing or rales.  Chest:     Chest wall: No tenderness.  Skin:    General: Skin is warm.     Coloration: Skin is not pale.     Findings: No erythema or rash.  Neurological:     General: No focal deficit present.     Mental Status: She is alert and oriented to person, place, and time. Mental status is at baseline.     Cranial Nerves: No cranial nerve deficit.     Sensory: No sensory deficit.     Motor: No weakness or abnormal muscle tone.     Coordination: Coordination normal.     Gait: Gait normal.     Deep Tendon Reflexes: Reflexes are normal and symmetric. Reflexes normal.  Psychiatric:        Behavior: Behavior normal.        Thought Content: Thought content normal.        Judgment: Judgment normal.           Assessment & Plan:  Epidermoid cyst Recommended general surgery consultation for excision and removal given the location.  Patient would like to see surgery in [place].  I will place the referral.  No indication for incision and drainage today.

## 2023-02-18 ENCOUNTER — Ambulatory Visit: Payer: 59 | Admitting: General Surgery

## 2023-02-18 ENCOUNTER — Encounter: Payer: Self-pay | Admitting: General Surgery

## 2023-02-18 VITALS — BP 138/85 | HR 91 | Temp 97.2°F | Resp 16 | Ht 69.5 in | Wt 256.0 lb

## 2023-02-18 DIAGNOSIS — L720 Epidermal cyst: Secondary | ICD-10-CM | POA: Diagnosis not present

## 2023-02-18 NOTE — Progress Notes (Signed)
 [facility] History and Physical  Reason for Referral: Epidermoid cyst Referring Physician: Duanne Butler DASEN, MD    Chief Complaint   New Patient (Initial Visit)     Crystal Clay is a 58 y.o. female.  HPI:  Crystal Clay is a 58 yo who comes in with an cyst on her head for about 1-2 years. She noted that this gets in her way of combing her hair. She reports that it has bled in the past. She has never had to have antibiotics. She is otherwise doing well. She has had no other cyst in the past.  Past Medical History:  Diagnosis Date   Anemia    Multinodular goiter    Smoker     History reviewed. No pertinent surgical history.  Family History  Problem Relation Age of Onset   Cancer Mother        breast   COPD Mother    Cancer Sister        breast   Heart disease Maternal Grandfather     Social History   Tobacco Use   Smoking status: Every Day   Smokeless tobacco: Never  Substance Use Topics   Alcohol use: No   Drug use: No    Medications: I have reviewed the patient's current medications. Allergies as of 02/18/2023       Reactions   5-alpha Reductase Inhibitors         Medication List        Accurate as of February 18, 2023 12:37 PM. If you have any questions, ask your nurse or doctor.          acetaminophen  500 MG tablet Commonly known as: TYLENOL  Take 500 mg by mouth every 6 (six) hours as needed for moderate pain.   BENGAY EX Apply 1 application topically daily as needed (pain).   ibuprofen 200 MG tablet Commonly known as: ADVIL Take 200 mg by mouth every 6 (six) hours as needed for moderate pain.         ROS:  A comprehensive review of systems was negative except for: Musculoskeletal: positive for joint pain back pain  Blood pressure 138/85, pulse 91, temperature (!) 97.2 F (36.2 C), temperature source Oral, resp. rate 16, height 5' 9.5 (1.765 m), weight 256 lb (116.1 kg), SpO2 90%. Physical Exam Vitals reviewed.   HENT:     Head: Normocephalic.     Comments: Left posterior scalp, 1.5cm superficial mass, consistent with cyst, loss of hair over the area     Nose: Nose normal.  Eyes:     Extraocular Movements: Extraocular movements intact.  Cardiovascular:     Rate and Rhythm: Normal rate.  Pulmonary:     Effort: Pulmonary effort is normal.  Abdominal:     General: There is no distension.     Palpations: Abdomen is soft.     Tenderness: There is no abdominal tenderness.  Musculoskeletal:        General: Normal range of motion.     Cervical back: Normal range of motion.  Skin:    General: Skin is warm.  Neurological:     General: No focal deficit present.     Mental Status: She is alert and oriented to person, place, and time.  Psychiatric:        Behavior: Behavior normal.        Thought Content: Thought content normal.     Results: No results found for this or any previous visit (from the past  48 hours).  No results found.   Assessment & Plan:  Crystal Clay is a 58 y.o. female with an epidermoid cyst. She wants it removed. Discussed the option of excision under local. Discussed risk of bleeding, infection, recurrence, finding something unexpected.    All questions were answered to the satisfaction of the patient.   Future Appointments  Date Time Provider Department Center  03/27/2023  3:00 PM Kallie Manuelita BROCKS, MD [facility] None    Manuelita BROCKS Kallie 02/18/2023, 12:37 PM

## 2023-02-18 NOTE — Patient Instructions (Signed)
 Can take 1000 mg of tylenol  30 minutes before your appt.    Epidermoid Cyst Removal Epidermoid cyst removal is a procedure to remove a fluid-filled sac that forms under your skin (epidermoid cyst). This type of cyst is filled with a thick, oily substance (keratin) that is secreted by your skin glands. Epidermoid cysts may also be called epidermal cysts, or keratin cysts. Normally, the skin secretes this pasty material through a gland or a hair follicle. However, when a skin gland or hair follicle becomes blocked, an epidermoid cyst can form. You may need this procedure if you have an epidermal cyst that becomes large, uncomfortable, or inflamed. Tell a health care provider about: Any allergies you have. All medicines you are taking, including vitamins, herbs, eye drops, creams, and over-the-counter medicines. Any problems you or family members have had with anesthetic medicines. Any blood disorders you have. Any surgeries you have had. Any medical conditions you have now or have had. Whether you are pregnant or may be pregnant. What are the risks? Generally, this is a safe procedure. However, problems may occur, including: Recurrence of the cyst. Bleeding. Infection. Scarring. What happens before the procedure? Ask your health care provider about: Changing or stopping your regular medicines. This is especially important if you are taking diabetes medicines or blood thinners. Taking medicines such as aspirin and ibuprofen. These medicines can thin your blood. Do not take these medicines unless your health care provider tells you to take them. Taking over-the-counter medicines, vitamins, herbs, and supplements. If you have an inflamed or infected cyst, you may have to take antibiotic medicine before the cyst removal. Take your antibiotic as told by your health care provider. Do not stop taking the antibiotic even if you start to feel better. Take a shower on the morning of your procedure.  Your health care provider may ask you to use a germ-killing soap. What happens during the procedure?  You will be given a medicine to numb the area (local anesthetic). The skin around the cyst will be cleaned with a germ-killing solution. The health care provider will make a small incision in your skin over the cyst. The health care provider will separate the cyst from the surrounding tissues that are under your skin. If possible, the cyst will be removed undamaged (intact). If the cyst bursts (ruptures), it will be removed in pieces. After the cyst is removed, the health care provider will control any bleeding and close the incision with small stitches (sutures). Small incisions may not need sutures, and the bleeding will be controlled by applying direct pressure with gauze. The health care provider may apply antibiotic ointment and a bandage (dressing) over the incision. The procedure may vary among health care providers and hospitals. What happens after the procedure? If you are prescribed an antibiotic medicine or ointment, take or apply it as told by your health care provider. Do not stop using the antibiotic even if you start to feel better. Summary Epidermoid cyst removal is a procedure to remove a sac that has formed under your skin. You may need this procedure if you have an epidermoid cyst that becomes large, uncomfortable, or inflamed. The health care provider will make a small incision in your skin to remove the cyst. If you are prescribed an antibiotic medicine before the procedure, after the procedure, or both, use the antibiotic as told by your health care provider. Do not stop using the antibiotic even if you start to feel better. This information is not  intended to replace advice given to you by your health care provider. Make sure you discuss any questions you have with your health care provider. Document Revised: 05/04/2019 Document Reviewed: 05/05/2019 Elsevier Patient  Education  2024 [employer].

## 2023-03-27 ENCOUNTER — Encounter: Payer: Self-pay | Admitting: General Surgery

## 2023-03-27 ENCOUNTER — Ambulatory Visit (INDEPENDENT_AMBULATORY_CARE_PROVIDER_SITE_OTHER): Payer: 59 | Admitting: General Surgery

## 2023-03-27 VITALS — BP 149/88 | HR 77 | Temp 98.2°F | Resp 14 | Ht 69.5 in | Wt 257.0 lb

## 2023-03-27 DIAGNOSIS — L720 Epidermal cyst: Secondary | ICD-10-CM

## 2023-03-27 MED ORDER — OXYCODONE HCL 5 MG PO TABS: 5.0000 mg | ORAL_TABLET | ORAL | 0 refills | Status: DC | PRN

## 2023-03-27 NOTE — Patient Instructions (Signed)
Discharge Instructions:  Common Complaints: Pain at the incision site is common.  Some drainage is common.  Use an old pillowcase.   Ok to shower and wash your hair. Neosporin/ bacitracin to the area daily for the first 5 days.   Medication: Take tylenol and ibuprofen as needed for pain control, alternating every 4-6 hours.  Example:  Tylenol 1000mg  @ 6am, 12noon, 6pm, (Do not exceed 4000mg  of tylenol a day). Ibuprofen 800mg  @ 9am, 3pm, 9pm, 3am (Do not exceed 3600mg  of ibuprofen a day).  Take Roxicodone for breakthrough pain every 4 hours.  Take Colace for constipation related to narcotic pain medication. If you do not have a bowel movement in 2 days, take Miralax over the counter.  Drink plenty of water to also prevent constipation.   Contact Information: If you have questions or concerns, please call our office, [phone], Monday- Thursday 8AM-5PM and Friday 8AM-12Noon.  If it is after hours or on the weekend, please call [facility], [phone], [phone] and ask to speak to the surgeon on call for Dr. Henreitta Leber at [facility].

## 2023-03-28 NOTE — Progress Notes (Signed)
[facility] Procedure Note  03/28/23  Pre-procedure Diagnosis:  Scalp cyst    Post-procedure Diagnosis: Same   Procedure(s) Performed: Excision of scalp cyst 1.5cm    Surgeon: Leatrice Jewels. Henreitta Leber, MD   Assistants: No qualified resident was available    Anesthesia: Lidocaine 1%    Specimens:  Cyst    Estimated Blood Loss: Minimal  Wound Class: Clean contaminated    Procedure Indications: Crystal Clay is a 58 yo who has a cyst on her head that has been getting larger. We discussed resection and risk of bleeding, infection, recurrence.   Findings: Cyst lesion 1.5cm    Procedure: The patient was taken to the procedure room and placed on her right side. The scalp was prepared and draped in the usual sterile fashion. Lidocaine 1% was injected.   An elliptical incision was made over the cyst and carried down through the scalp. With sharp dissection the cyst was removed. There was minor spillage of a brownish keratin contents. The cyst was about 1.5cm in size. The cavity was made hemostatic with cautery and to ensure the wall was completely desiccated.  The skin was closed with 3-0 Nylon. Bacitracin was placed on the area.  Final inspection revealed acceptable hemostasis. The patient tolerated the procedure well.    Algis Greenhouse, MD [facility] [address] [place], [place] [address] [phone] (office)

## 2023-04-01 LAB — TISSUE SPECIMEN

## 2023-04-01 LAB — PATHOLOGY REPORT

## 2023-04-09 ENCOUNTER — Ambulatory Visit (INDEPENDENT_AMBULATORY_CARE_PROVIDER_SITE_OTHER): Payer: 59 | Admitting: General Surgery

## 2023-04-09 DIAGNOSIS — L720 Epidermal cyst: Secondary | ICD-10-CM

## 2023-04-09 NOTE — Progress Notes (Signed)
 [facility]  I am calling the patient for post operative evaluation. This is not a billable encounter as it is under the global charges for the surgery.  The patient had a cyst removal on 2/13. The patient reports that she is doing well. She had a scab on the area when my RN removed the the sutures, recommended neosporin to the area and gentle baby shampoo for the next week. Monitor and call with any issues.  Pathology:  Disrupted and inflamed epidermal inclusion  cyst.   Will see the patient PRN.   Algis Greenhouse, MD [facility] [address] [place], [place] [address] [phone] (office)

## 2023-08-18 ENCOUNTER — Other Ambulatory Visit (HOSPITAL_COMMUNITY): Payer: Self-pay | Admitting: Family Medicine

## 2023-08-18 DIAGNOSIS — Z1231 Encounter for screening mammogram for malignant neoplasm of breast: Secondary | ICD-10-CM

## 2023-08-25 ENCOUNTER — Encounter (HOSPITAL_COMMUNITY): Payer: Self-pay

## 2023-08-25 ENCOUNTER — Ambulatory Visit (HOSPITAL_COMMUNITY)
Admission: RE | Admit: 2023-08-25 | Discharge: 2023-08-25 | Disposition: A | Discharge status: Home / Self Care | Source: Ambulatory Visit | Attending: Family Medicine | Admitting: Family Medicine

## 2023-08-25 DIAGNOSIS — Z1231 Encounter for screening mammogram for malignant neoplasm of breast: Secondary | ICD-10-CM | POA: Diagnosis present

## 2023-11-10 ENCOUNTER — Encounter: Payer: Self-pay | Admitting: Family Medicine

## 2023-11-10 ENCOUNTER — Ambulatory Visit (INDEPENDENT_AMBULATORY_CARE_PROVIDER_SITE_OTHER): Admitting: Family Medicine

## 2023-11-10 VITALS — BP 132/82 | HR 89 | Temp 98.5°F | Ht 69.5 in | Wt 256.0 lb

## 2023-11-10 DIAGNOSIS — Z13220 Encounter for screening for lipoid disorders: Secondary | ICD-10-CM

## 2023-11-10 DIAGNOSIS — F17200 Nicotine dependence, unspecified, uncomplicated: Secondary | ICD-10-CM | POA: Diagnosis not present

## 2023-11-10 DIAGNOSIS — Z0000 Encounter for general adult medical examination without abnormal findings: Secondary | ICD-10-CM

## 2023-11-10 DIAGNOSIS — Z1211 Encounter for screening for malignant neoplasm of colon: Secondary | ICD-10-CM

## 2023-11-10 DIAGNOSIS — Z0001 Encounter for general adult medical examination with abnormal findings: Secondary | ICD-10-CM

## 2023-11-10 LAB — LIPID PANEL
Cholesterol: 161 mg/dL (ref <200)
HDL: 42 mg/dL — ABNORMAL LOW (ref >=50)
LDL Cholesterol (Calc): 102 mg/dL — ABNORMAL HIGH
Non-HDL Cholesterol (Calc): 119 mg/dL (ref <130)
Total CHOL/HDL Ratio: 3.8 (calc) (ref <5.0)
Triglycerides: 77 mg/dL (ref <150)

## 2023-11-10 LAB — COMPREHENSIVE METABOLIC PANEL WITH GFR
AG Ratio: 1.3 (calc) (ref 1.0–2.5)
ALT: 12 U/L (ref 6–29)
AST: 16 U/L (ref 10–35)
Albumin: 4.1 g/dL (ref 3.6–5.1)
Alkaline phosphatase (APISO): 141 U/L (ref 37–153)
BUN: 10 mg/dL (ref 7–25)
CO2: 30 mmol/L (ref 20–32)
Calcium: 8.8 mg/dL (ref 8.6–10.4)
Chloride: 105 mmol/L (ref 98–110)
Creat: 0.79 mg/dL (ref 0.50–1.03)
Globulin: 3.1 g/dL (ref 1.9–3.7)
Glucose, Bld: 84 mg/dL (ref 65–99)
Potassium: 4.3 mmol/L (ref 3.5–5.3)
Sodium: 140 mmol/L (ref 135–146)
Total Bilirubin: 0.4 mg/dL (ref 0.2–1.2)
Total Protein: 7.2 g/dL (ref 6.1–8.1)
eGFR: 87 mL/min/1.73m2 (ref >=60)

## 2023-11-10 LAB — CBC WITH DIFFERENTIAL/PLATELET
Absolute Lymphocytes: 2685 {cells}/uL (ref 850–3900)
Absolute Monocytes: 448 {cells}/uL (ref 200–950)
Basophils Absolute: 18 {cells}/uL (ref 0–200)
Basophils Relative: 0.3 %
Eosinophils Absolute: 118 {cells}/uL (ref 15–500)
Eosinophils Relative: 2 %
HCT: 41.7 % (ref 35.0–45.0)
Hemoglobin: 14.5 g/dL (ref 11.7–15.5)
MCH: 32 pg (ref 27.0–33.0)
MCHC: 34.8 g/dL (ref 32.0–36.0)
MCV: 92.1 fL (ref 80.0–100.0)
MPV: 10.4 fL (ref 7.5–12.5)
Monocytes Relative: 7.6 %
Neutro Abs: 2631 {cells}/uL (ref 1500–7800)
Neutrophils Relative %: 44.6 %
Platelets: 275 Thousand/uL (ref 140–400)
RBC: 4.53 Million/uL (ref 3.80–5.10)
RDW: 12.8 % (ref 11.0–15.0)
Total Lymphocyte: 45.5 %
WBC: 5.9 Thousand/uL (ref 3.8–10.8)

## 2023-11-10 NOTE — Progress Notes (Signed)
 Subjective:    Patient ID: Crystal Clay, female    DOB: 1965-06-02, 58 y.o.   MRN: [id number] Patient is a 58 year old African-American female who is here today for physical exam.  Unfortunately, she continues to smoke.  Her immunizations are up-to-date except for the flu shot, shingles shot, and a COVID shot.  Patient had a mammogram in July that was normal.  Her Pap smear was performed in 2023 and was normal.  This is due next year.  Her last Cologuard was in 2021.  She is due to repeat that now.  She agrees to receive Cologuard.  She is also due for lung cancer screening.  She agrees to this as well. Past Medical History:  Diagnosis Date   Anemia    Multinodular goiter    Smoker    No past surgical history on file. Current Outpatient Medications on File Prior to Visit  Medication Sig Dispense Refill   ibuprofen (ADVIL) 200 MG tablet Take 200 mg by mouth every 6 (six) hours as needed for moderate pain.      Menthol, Topical Analgesic, (BENGAY EX) Apply 1 application topically daily as needed (pain).     No current facility-administered medications on file prior to visit.   Allergies  Allergen Reactions   5-Alpha Reductase Inhibitors    Social History   Socioeconomic History   Marital status: Divorced    Spouse name: Not on file   Number of children: Not on file   Years of education: Not on file   Highest education level: Associate degree: occupational, Scientist, product/process development, or vocational program  Occupational History   Not on file  Tobacco Use   Smoking status: Every Day   Smokeless tobacco: Never  Substance and Sexual Activity   Alcohol use: No   Drug use: No   Sexual activity: Not on file    Comment: divorced, 1 son, Environmental health practitioner  Other Topics Concern   Not on file  Social History Narrative   Not on file   Social Drivers of Health   Financial Resource Strain: Low Risk  (11/06/2023)   Overall Financial Resource Strain (CARDIA)    Difficulty of Paying Living  Expenses: Not very hard  Food Insecurity: Unknown (11/06/2023)   Hunger Vital Sign    Worried About Running Out of Food in the Last Year: Never true    Ran Out of Food in the Last Year: Not on file  Transportation Needs: No Transportation Needs (11/06/2023)   PRAPARE - Administrator, Civil Service (Medical): No    Lack of Transportation (Non-Medical): No  Physical Activity: Insufficiently Active (11/06/2023)   Exercise Vital Sign    Days of Exercise per Week: 3 days    Minutes of Exercise per Session: 10 min  Stress: No Stress Concern Present (11/06/2023)   [employer] of Occupational Health - Occupational Stress Questionnaire    Feeling of Stress: Not at all  Social Connections: Moderately Isolated (11/06/2023)   Social Connection and Isolation Panel    Frequency of Communication with Friends and Family: More than three times a week    Frequency of Social Gatherings with Friends and Family: More than three times a week    Attends Religious Services: 1 to 4 times per year    Active Member of [employer] or Organizations: No    Attends Engineer, structural: Not on file    Marital Status: Divorced  Intimate Partner Violence: Not on file   Family History  Problem Relation Age of Onset   Breast cancer Mother    Cancer Mother        breast   COPD Mother    Breast cancer Sister    Cancer Sister        breast   Heart disease Maternal Grandfather      Review of Systems  All other systems reviewed and are negative.      Objective:   Physical Exam Vitals reviewed.  Constitutional:      General: She is not in acute distress.    Appearance: She is well-developed. She is not diaphoretic.  HENT:     Head: Normocephalic and atraumatic.     Right Ear: Tympanic membrane, ear canal and external ear normal.     Left Ear: Tympanic membrane, ear canal and external ear normal.     Nose: Nose normal.     Mouth/Throat:     Pharynx: No oropharyngeal exudate.  Eyes:      General: No scleral icterus.       Right eye: No discharge.        Left eye: No discharge.     Conjunctiva/sclera: Conjunctivae normal.     Pupils: Pupils are equal, round, and reactive to light.  Neck:     Thyroid : Thyromegaly present.     Vascular: No carotid bruit or JVD.     Trachea: No tracheal deviation.  Cardiovascular:     Rate and Rhythm: Normal rate and regular rhythm.     Heart sounds: Normal heart sounds. No murmur heard.    No friction rub. No gallop.  Pulmonary:     Effort: Pulmonary effort is normal. No respiratory distress.     Breath sounds: No stridor. Wheezing present. No rales.  Chest:     Chest wall: No tenderness.  Abdominal:     General: Bowel sounds are normal. There is no distension.     Palpations: Abdomen is soft. There is no mass.     Tenderness: There is no abdominal tenderness. There is no guarding or rebound.  Musculoskeletal:        General: No tenderness. Normal range of motion.     Cervical back: Normal range of motion and neck supple. No rigidity.  Lymphadenopathy:     Cervical: No cervical adenopathy.  Skin:    General: Skin is warm.     Coloration: Skin is not pale.     Findings: No erythema or rash.  Neurological:     General: No focal deficit present.     Mental Status: She is alert and oriented to person, place, and time. Mental status is at baseline.     Cranial Nerves: No cranial nerve deficit.     Sensory: No sensory deficit.     Motor: No weakness or abnormal muscle tone.     Coordination: Coordination normal.     Gait: Gait normal.     Deep Tendon Reflexes: Reflexes are normal and symmetric. Reflexes normal.  Psychiatric:        Behavior: Behavior normal.        Thought Content: Thought content normal.        Judgment: Judgment normal.           Assessment & Plan:   Screening cholesterol level - Plan: CBC with Differential/Platelet, Comprehensive metabolic panel with GFR, Lipid panel  Colon cancer screening - Plan:  Cologuard  Smoker - Plan: CT CHEST LUNG CA SCREEN LOW DOSE W/O CM  General medical  exam Physical exam today is normal.  Her blood pressure is excellent 132/82.  I encouraged her to quit smoking.  Schedule the patient for CT scan of the lungs to screen for lung cancer.  Pap smear is up-to-date.  Schedule the patient to receive Cologuard to screen for colon cancer.  Meanwhile check CBC, CMP, and lipid panel.  Recommended a flu shot, recommended the shingles shot, recommended a COVID shot.  Patient defers all vaccinations today

## 2023-11-11 ENCOUNTER — Ambulatory Visit: Payer: Self-pay | Admitting: Family Medicine

## 2023-11-28 LAB — COLOGUARD: COLOGUARD: NEGATIVE

## 2023-12-05 ENCOUNTER — Ambulatory Visit: Admitting: Family Medicine

## 2023-12-05 ENCOUNTER — Ambulatory Visit (INDEPENDENT_AMBULATORY_CARE_PROVIDER_SITE_OTHER)

## 2023-12-05 ENCOUNTER — Other Ambulatory Visit: Payer: Self-pay | Admitting: *Deleted

## 2023-12-05 DIAGNOSIS — Z23 Encounter for immunization: Secondary | ICD-10-CM

## 2023-12-05 DIAGNOSIS — Z122 Encounter for screening for malignant neoplasm of respiratory organs: Secondary | ICD-10-CM

## 2023-12-05 DIAGNOSIS — F17210 Nicotine dependence, cigarettes, uncomplicated: Secondary | ICD-10-CM

## 2023-12-05 DIAGNOSIS — Z87891 Personal history of nicotine dependence: Secondary | ICD-10-CM

## 2023-12-05 NOTE — Progress Notes (Signed)
 Patient is in office today for a nurse visit for Flu and Shingles Immunization. Patient Injection was given in the  Left deltoid. Patient tolerated injection well.

## 2023-12-17 ENCOUNTER — Ambulatory Visit (HOSPITAL_COMMUNITY)
Admission: RE | Admit: 2023-12-17 | Discharge: 2023-12-17 | Disposition: A | Discharge status: Home / Self Care | Source: Ambulatory Visit | Attending: Acute Care | Admitting: Acute Care

## 2023-12-17 DIAGNOSIS — Z87891 Personal history of nicotine dependence: Secondary | ICD-10-CM | POA: Diagnosis present

## 2023-12-17 DIAGNOSIS — Z122 Encounter for screening for malignant neoplasm of respiratory organs: Secondary | ICD-10-CM | POA: Insufficient documentation

## 2023-12-17 DIAGNOSIS — F17210 Nicotine dependence, cigarettes, uncomplicated: Secondary | ICD-10-CM | POA: Insufficient documentation

## 2023-12-22 ENCOUNTER — Other Ambulatory Visit: Payer: Self-pay

## 2023-12-22 DIAGNOSIS — F17210 Nicotine dependence, cigarettes, uncomplicated: Secondary | ICD-10-CM

## 2023-12-22 DIAGNOSIS — Z87891 Personal history of nicotine dependence: Secondary | ICD-10-CM

## 2023-12-22 DIAGNOSIS — Z122 Encounter for screening for malignant neoplasm of respiratory organs: Secondary | ICD-10-CM

## 2024-03-05 ENCOUNTER — Ambulatory Visit

## 2024-03-19 ENCOUNTER — Ambulatory Visit: Admitting: Family Medicine

## 2024-03-19 DIAGNOSIS — Z23 Encounter for immunization: Secondary | ICD-10-CM

## 2024-03-19 NOTE — Progress Notes (Signed)
 Patient is in office today for a nurse visit for Shingrix  #2 Immunization. Patient Injection was given in the  Left deltoid. Patient tolerated injection well.

## 2024-11-12 ENCOUNTER — Encounter: Admitting: Family Medicine
